# Patient Record
Sex: Female | Born: 1990 | Race: White | Hispanic: No | Marital: Married
Health system: Midwestern US, Academic
[De-identification: ages and names within clinical notes are randomized; demographics above are authoritative.]

---

## 2020-08-09 ENCOUNTER — Encounter: Admit: 2020-08-09 | Discharge: 2020-08-09 | Payer: BC Managed Care – PPO

## 2020-08-09 DIAGNOSIS — C569 Malignant neoplasm of unspecified ovary: Secondary | ICD-10-CM

## 2020-08-10 ENCOUNTER — Encounter: Admit: 2020-08-10 | Discharge: 2020-08-10 | Payer: BC Managed Care – PPO

## 2020-08-10 DIAGNOSIS — R1900 Intra-abdominal and pelvic swelling, mass and lump, unspecified site: Secondary | ICD-10-CM

## 2020-08-10 NOTE — Telephone Encounter
RN faxed lab orders to [facility] [phone]) with confirmation.

## 2020-08-11 ENCOUNTER — Encounter: Admit: 2020-08-11 | Discharge: 2020-08-11 | Payer: BC Managed Care – PPO

## 2020-08-11 NOTE — Progress Notes
Interventional Radiology Outpatient Procedure Review Assessment        Reason for consult: Evaluate for pelvic masses      History of present illness:  Kristina Velazquez is a 30 y.o. 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  - Previous IR Anesthetic/Sedation History (if applicable): none  - Case discussed with referring Physician (Y/N): N  - Contraindications to procedure: none      Plan:  - This case has been reviewed and approved for IR percutaneous biopsy  - Patient position and modality: supine and [place]  - Intra-procedural Sedation/Medication Plan: Moderate sedation  - Procedure time frame: 10-14 days  - Anticoagulation: Per IR peri procedural anticoagulation management protocol  - Labs: Per IR pre procedural lab protocol  - Additional requests after review: none    Lab/Radiology/Other Diagnostic Tests:  Labs:  No pertinent labs             Noah Delaine, MD

## 2020-08-14 ENCOUNTER — Encounter: Admit: 2020-08-14 | Discharge: 2020-08-14 | Payer: BC Managed Care – PPO

## 2020-08-14 DIAGNOSIS — C569 Malignant neoplasm of unspecified ovary: Secondary | ICD-10-CM

## 2020-08-14 DIAGNOSIS — R1900 Intra-abdominal and pelvic swelling, mass and lump, unspecified site: Secondary | ICD-10-CM

## 2020-08-14 DIAGNOSIS — Z8481 Family history of carrier of genetic disease: Secondary | ICD-10-CM

## 2020-08-14 NOTE — Progress Notes
Interventional Radiology Outpatient Scheduling Checklist      1.  Name of Procedure(s):   Dual US Guided procedures:   Abdominal mass biopsy and Paracentesis    08/11/2020 9:01 AM CDT by Melba Coon     Approved? Yes   Attending Provider Reviewing the Case: Collins   Case Type: Body   Modality: Ultrasound   Position: Supine   Sedation Type: Moderate   Preferred Location: [facility]   IR Assessment & plan note completed Yes           2.  Date of Procedure:   08/16/2020      3.  Arrival Time:   1000      4.  Procedure Time:  1100      5.  Correct Procedural Room Assignment:  IR [facility] Room #6      6.  Blood Thinners Triaged and instructed per protocol: Y/N/NA:  NA  Confirmed accurate instructions sent to patient: Y/N:  NA       7.  Procedure Order Verified: Y/N:  Yes      9.  Patient instructed to have a driver: Y/N/NA:  Yes    10.  Patient instructed on NPO status: Y/N/NA:  Yes  0300 and 0900  Confirmed accurate instructions sent to patient: Y/N:  Yes    11.  Specimen needed: Y/N/NA:  Yes   Verified Order placed: Y/N:  Yes    12.  Allergies Verified:  Y/N:  Yes    13.  Is there an Iodine Allergy: Y/N:  No  Does the Procedure Require contrast: Y/N:  NO  If so, was the IR- Contrast Allergy Pre-Procedure Medication protocol ordered: Y/NA:  NA    14.  Does the patient have labs according to IR Pre-procedure Laboratory Parameter policy: Y/N/NA:  Yes   Located in Outside records  If No, was the patient instructed to obtain labs prior to procedure: Y/N/NA:  NA     15.  Will the patient need to be admitted or have a possible admission: Y/N:  No  If yes, confirmed accurate instructions sent to patient: Y/N/NA:  NA     16.  Patient States Understanding: Y/N:  Yes    17.  History of OSA:  Y/N:  No  If yes, confirm request to bring CPAP sent to patient: Y/N/NA:  NA    18. Does the patient have an insulin pump or continuous glucose monitor? Y/N  N/A   If yes, was the patient instructed that this will need to be removed for this procedure and to bring supplies to reapply once the procedure is complete? Y/N    19. Patient was sent electronic procedure instructions: Y/N:  Yes

## 2020-08-14 NOTE — Patient Education
Dear Ms Kristina Velazquez,    Thank you for choosing [facility] Interventional Radiology for your procedure. Your appointment information is listed below:    Appointment Date: 08/16/2020  Appointment Time:    11AM   Arrival Time:      10AM  Location:     ? Main Campus: [address] [place], [place]  [address]  Parking: [address] Park[address]age    INTERVENTIONAL RADIOLOGY  PRE-PROCEDURE INSTRUCTIONS SEDATION    You are scheduled for a procedure in Interventional Radiology with procedural sedation.  Please follow these instructions and any direction from your Primary Care/Managing Physician.  If you have questions about your procedure or need to reschedule please call [phone].    Medication Instructions:   You may take the following medications with a small sip of water:  Continue taking your normal morning medications as directed.    Diet Instructions:  a. (8) hours 3AM before your procedure, stop your regular diet and start a clear liquid diet.  b. (6) hours before your procedure, discontinue tube feedings and chewing tobacco.  c. (2) hours 9AM before your procedure discontinue clear liquids.  You should have nothing by mouth. This includes GUM or CANDY.     Clear Liquid Diet    Water  Apple or White Grape Juice  Coffee or tea without cream   Tea  White Cranberry Juice  Chicken Bouillon or Broth (no noodles)  Soda Pop  Popsicles   Beef Bouillon or Broth (no noodles)    Day of Exam Instructions:  1. Bathe or shower with an antibacterial soap prior to your appointment.  2. If you have a history of Obstructive Sleep Apnea (OSA) bring your CPAP/BIPAP.   3. Bring a list of your current medications and the dosages.  4. Wear comfortable clothing and leave valuables at home.  5. Arrive (1) hour prior to your appointment.  This time will be spent registering, interviewing, assessing, educating and preparing you for the test.  ? You will be with [place] anywhere from 30 minutes to 6 hours after your exam depending on your procedure.  6. You will be sedated for the procedure. A responsible adult must drive you home (no Bened[employer] Goad, taxis or buses are allowed) and stay with you overnight. If you do not have a driver we will be unable to perform your procedure.   7. You will not be able to return to work or drive the same day if receiving sedation.

## 2020-08-16 ENCOUNTER — Ambulatory Visit: Admit: 2020-08-16 | Discharge: 2020-08-16 | Payer: BC Managed Care – PPO

## 2020-08-16 ENCOUNTER — Encounter: Admit: 2020-08-16 | Discharge: 2020-08-16 | Payer: BC Managed Care – PPO

## 2020-08-16 DIAGNOSIS — R1900 Intra-abdominal and pelvic swelling, mass and lump, unspecified site: Secondary | ICD-10-CM

## 2020-08-16 DIAGNOSIS — C569 Malignant neoplasm of unspecified ovary: Principal | ICD-10-CM

## 2020-08-16 MED ORDER — FENTANYL CITRATE (PF) 50 MCG/ML IJ SOLN
0 refills | Status: CP
Start: 2020-08-16 — End: ?
  Administered 2020-08-16 (×3): 50 ug via INTRAVENOUS

## 2020-08-16 MED ORDER — MIDAZOLAM 1 MG/ML IJ SOLN
1 mg | Freq: Once | INTRAVENOUS | 0 refills | Status: CP
Start: 2020-08-16 — End: ?
  Administered 2020-08-16: 16:00:00 1 mg via INTRAVENOUS

## 2020-08-16 MED ORDER — MIDAZOLAM 1 MG/ML IJ SOLN
0 refills | Status: CP
Start: 2020-08-16 — End: ?
  Administered 2020-08-16 (×3): 1 mg via INTRAVENOUS

## 2020-08-16 NOTE — Other
Immediate Post Procedure Note    Date:  08/16/2020                                         Attending Physician:   Baldwin Jamaica, MD  Performing Provider:  Erick Colace, MD    Consent:  Consent obtained from patient.  Time out performed: Consent obtained, correct patient verified, correct procedure verified, correct site verified, patient marked as necessary.  Pre/Post Procedure Diagnosis:  Abdominal masses, ascites  Indications:  Abdominal masses, ascites      Procedure(s):  US guided paracentesis, US guided mass biopsy  Findings:  Very small volume ascites near liver, large pelvic mass     Estimated Blood Loss:  None/Negligible  Specimen(s) Removed/Disposition:  Yes, sent to pathology  Complications: None  Patient Tolerated Procedure: Well  Post-Procedure Condition:  stable    Erick Colace, MD

## 2020-08-16 NOTE — Progress Notes
Sedation physician present in room. Recent vitals and patient condition reviewed between sedating physician and nurse. Reassessment completed. Determination made to proceed with planned sedation.

## 2020-08-16 NOTE — Progress Notes
Discharge instructions reviewed and pt verbalizes understaning. Pt ready for d/c at this time. Denies pain, sob or cp. Pt is able to ambulate to transfer self to w/c. Pt taken to exit in w/c with all belongings and printed AVS. NAD VSS

## 2020-08-16 NOTE — H&P (View-Only)
Pre Procedure History and Physical/Sedation Plan-OP    Procedure Date: 08/16/2020     Planned Procedure(s):  Pelvic mass biopsy and paracentesis     Indication for exam:  Pelvic mass, ascites   __________________________________________________________________    Chief Complaint:  See above     History of Present Illness: Kristina Velazquez is a 30 y.o. female that presents to IR today for pelvic mass biopsy and paracentesis. Patient reports feeling well except for anxiety r/t procedure and some abdominal distention.     There are no problems to display for this patient.    No past medical history on file.   No past surgical history on file.   Medications Prior to Admission   Medication Sig Dispense Refill Last Dose   ? ALPRAZolam (XANAX) 0.5 mg tablet Take 0.5 mg by mouth twice daily as needed for Anxiety.   Past Week   ? dextroamphetamine/amphetamine (ADDERALL XR PO) Take 30 mg by mouth once.   Past Week   ? ondansetron HCL (ZOFRAN) 4 mg tablet Take 4 mg by mouth every 8 hours as needed for Nausea or Vomiting.   Past Week     Allergies   Allergen Reactions   ? Peanut RASH   ? Hydrocodone STOMACH UPSET       Social History:   Social History     Tobacco Use   ? Smoking status: Not on file   ? Smokeless tobacco: Not on file   Substance Use Topics   ? Alcohol use: Not on file      No family history on file.     Review of Systems  Constitutional: negative for fevers and chills  Respiratory: negative for cough or dyspnea  Gastrointestinal: negative for nausea, vomiting and abdominal pain    Previous Anesthetic/Sedation History:  Denies adverse events r/t sedation/anesthesia.      Code Status: Full Code    Physical Exam:  Vital Signs: Last Filed In 24 Hours Vital Signs: 24 Hour Range   BP: 124/89 (07/20 1004)  Temp: 36.9 ?C (98.4 ?F) (07/20 1004)  Pulse: 107 (07/20 1004)  Respirations: 24 PER MINUTE (07/20 1004)  SpO2: 99 % (07/20 1004)  O2 Device: None (Room air) (07/20 1004)  SpO2 Pulse: 104 (07/20 1004) BP: (124)/(89)   Temp:  [36.9 ?C (98.4 ?F)]   Pulse:  [107]   Respirations:  [24 PER MINUTE]   SpO2:  [99 %]   O2 Device: None (Room air)            General appearance: alert and no distress  Neurologic: Grossly normal, at baseline  Lungs: Nonlabored with normal effort  Abdomen: soft, distended        Airway:  airway assessment performed  Mallampati II (soft palate, uvula, fauces visible)   Anesthesia Classification:  ASA II (A normal patient with mild systemic disease)  Pre procedure anxiolysis plan: Midazolam  Sedation/Medication Plan: Fentanyl, Lorazepam and Midazolam  Personal history of sedation complications: Denies adverse event.   Family history of sedation complications: Denies adverse event.   Medications for Reversal: Naloxone and Flumazenil  Discussion/Reviews:  Physician has discussed risks and alternatives of this type of sedation and above planned procedures with patient  NPO Status: Acceptable  Pregnancy Status: N/A        Lab/Radiology/Other Diagnostic Tests:  Labs:  Pertinent labs reviewed - OSH labs from 08/10/2020 hgb 13.7, plt 456           Kelyn Koskela M Nugent, APRN-NP  Pager  [phone]

## 2020-08-16 NOTE — Patient Instructions
INTERVENTIONAL RADIOLOGY DISCHARGE INSTRUCTIONS  PERCUTANEOUS BIOPSY  A percutaneous biopsy is a procedure in which a tiny sample of tissue is taken by using a needle inserted through your skin (percutaneously) into the affected area.?This procedure may also be referred to as a ?fine needle aspiration?Marland Kitchen The radiologist will determine whether the procedure is to be done using ultrasound or CT guidance.?  POST-PROCEDURE PAIN:   ? Pain control following your procedure is a priority for both you and your Physicians.  ? Some soreness or tenderness at the site is to be expected for several days. We recommend taking over the counter analgesics to help relieve this pain.  ? Alternative methods for pain relief include but not limited to heat or cold compress, relaxation techniques, rest, and changing of positions.?  ? If pain continues after 5-7 days or you have severe pain not relieved by medication, please contact us as directed below.?  POST-PROCEDURE ACTIVITY:   ? A responsible adult must drive you home. ???  ? If you receive sedation, narcotic pain medication or anesthesia for the procedure, you should not drive or operate heavy machinery or do anything that requires concentration for at least 24 hours after procedure completion.  ? It is recommended that a responsible adult be with you until morning.  ? Other activity restrictions such as lifting restrictions will depend upon the biopsy area and will be determined by the physician after your procedure.?  POST-PROCEDURE SITE CARE:  ? You will have a small bandage over the procedure site.? Keep this dry.?  ? You may remove it in 24 hours.  ? You may shower in 24 hours, after removing the bandage.  ? Do not submerge the procedure site for 1 week (no bathtub, swimming, hot tub, etc.)  ? Do not use ointments, creams or powders on the puncture site.  ? Be sure your hands are clean when touching near the site.?  DIET/MEDICATIONS:   ? You may resume your previous diet after the procedure.  ? If you receive sedation or narcotic pain medications, avoid any foods or beverages containing alcohol for at least 24 hours after the procedure.  ? Please see the Medication Reconciliation sheet for instructions regarding resuming your home medications.?  CALL THE DOCTOR IF:   ? Bright red blood soaks the bandage.  ? You have pain not relieved by medication.? Some soreness at the site is to be expected.  ? You have signs of infection such as: fever greater than 101F, chills, redness, warmth, swelling, drainage or pus from the puncture site.  ?  For any of the above symptoms or for problems or concerns related to the procedure performed at the [facility] location, call?[phone] Monday-Friday from 7-5p.? After-hours and weekends, please call?[phone] and ask for the Interventional Radiology Resident on-call.  YOU OR YOUR CAREGIVER SHOULD CALL 911 FOR ANY SEVERE SYMPTOMS SUCH AS EXCESSIVE BLEEDING, SEVERE DIZZINESS, TROUBLE BREATHING OR LOSS OF CONSCIOUSNESS.  ?  ?              IR-PARACENTESIS   A paracentesis is the removal of an abnormal buildup of fluid in your abdominal cavity. This fluid buildup is called ascites and may be caused by conditions such as liver disease, heart failure, or cancer. During this procedure, a needle is inserted into your abdomen to drain the fluid. The fluid may then be sent to the lab for testing if medically indicated. Removal of the fluid may also relieve belly pressure and shortness of breath  caused by the ascites.?  POST-PROCEDURE PAIN:   ? Pain control following your procedure is a priority for both you and your Physicians.  ? Some soreness or tenderness at the site is to be expected for several days. We recommend taking over the counter analgesics to help relieve this pain.  ? Alternative methods for pain relief include but not limited to heat or cold compress, relaxation techniques, rest, and changing of positions.  ? If pain continues after 5-7 days or you have severe pain not relieved by medication, please contact us as directed below.?  POST-PROCEDURE ACTIVITY:   ? A responsible adult must drive you home.  ? If you receive sedation, narcotic pain medication or anesthesia for the procedure, you should not drive or operate heavy machinery or do anything that requires concentration for at least 24 hours after procedure completion.  ? It is recommended that a responsible adult be with you until morning.?  POST-PROCEDURE SITE CARE:   ? You will have a small bandage over the procedure site. Keep this dry.  ? You may remove it in 24 hours.  ? You may shower in 24 hours, after removing the bandage.  ? A dry gauze bandage may be reapplied as necessary to protect your clothing as the site may sometimes leak for several days after the procedure.  ? Do not submerge the procedure site for 1 week (no bathtub, swimming, hot tub, etc.)  ? Do not use ointments, creams or powders on the puncture site.  ? Be sure your hands are clean when touching near the site.?  DIET/MEDICATIONS:   ? You may resume your previous diet after the procedure.  ? If you receive sedation or narcotic pain medications, avoid any foods or beverages containing alcohol for at least 24 hours after the procedure.  ? Please see the Medication Reconciliation sheet for instructions regarding resuming your home medications.?  CALL THE DOCTOR IF:   ? Bright red blood soaks the bandage.  ? You have pain not relieved by medication. Some soreness at the site is to be expected.  ? You have signs of infection such as: fever greater than 101F, chills, redness, warmth, swelling, drainage or pus from the puncture site.  For any of the above symptoms or for problems or concerns related to the procedure performed at the [facility], call [phone] Monday-Friday from 7-5p. After-hours and weekends, please call [phone] and ask for the Interventional Radiology Resident on-call.   You or your caregiver should call 911 for any severe symptoms such as excessive bleeding, severe dizziness, trouble breathing or loss of consciousness.   ?  ?

## 2020-08-17 ENCOUNTER — Encounter: Admit: 2020-08-17 | Discharge: 2020-08-17 | Payer: BC Managed Care – PPO

## 2020-08-17 NOTE — Telephone Encounter
Received incoming VM from Dr. 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 Grading still pending additional immunostains". This RN will notify Dr. Martin Majestic of this information. Patient is currently scheduled for new patient consult on 09/04/20.

## 2020-08-21 ENCOUNTER — Encounter: Admit: 2020-08-21 | Discharge: 2020-08-21 | Payer: BC Managed Care – PPO

## 2020-08-21 DIAGNOSIS — C569 Malignant neoplasm of unspecified ovary: Secondary | ICD-10-CM

## 2020-08-21 LAB — PROTIME INR (PT)
INR: 1.1 (ref 0.8–1.2)
PROTIME: 12 s (ref 9.5–14.2)

## 2020-08-21 LAB — CA125: CA-125: 143 U/mL — ABNORMAL HIGH (ref <35)

## 2020-08-21 LAB — PTT (APTT): PTT: 31 s (ref 24.0–36.5)

## 2020-08-22 ENCOUNTER — Encounter: Admit: 2020-08-22 | Discharge: 2020-08-22 | Payer: BC Managed Care – PPO

## 2020-08-22 ENCOUNTER — Ambulatory Visit: Admit: 2020-08-22 | Discharge: 2020-08-22 | Payer: BC Managed Care – PPO

## 2020-08-22 DIAGNOSIS — C569 Malignant neoplasm of unspecified ovary: Secondary | ICD-10-CM

## 2020-08-22 MED ORDER — FAMOTIDINE (PF) 20 MG/2 ML IV SOLN
20 mg | Freq: Once | INTRAVENOUS | 0 refills
Start: 2020-08-22 — End: ?

## 2020-08-22 MED ORDER — PALONOSETRON 0.25 MG/5 ML IV SOLN
.25 mg | Freq: Once | INTRAVENOUS | 0 refills
Start: 2020-08-22 — End: ?

## 2020-08-22 MED ORDER — CARBOPLATIN IVPB (BY AUC-GOG)
899.4 mg | Freq: Once | INTRAVENOUS | 0 refills
Start: 2020-08-22 — End: ?

## 2020-08-22 MED ORDER — SODIUM CHLORIDE 0.9 % IV SOLP
500 mL | Freq: Once | INTRAVENOUS | 0 refills
Start: 2020-08-22 — End: ?

## 2020-08-22 MED ORDER — NS WITH POTASSIUM CHLOR 20 MEQ, MAG SULF 2 G/L IV INFUSION
Freq: Once | INTRAVENOUS | 0 refills
Start: 2020-08-22 — End: ?

## 2020-08-22 MED ORDER — DEXAMETHASONE IVPB
20 mg | Freq: Once | INTRAVENOUS | 0 refills
Start: 2020-08-22 — End: ?

## 2020-08-22 MED ORDER — PACLITAXEL 500ML IVPB
175 mg/m2 | Freq: Once | INTRAVENOUS | 0 refills
Start: 2020-08-22 — End: ?

## 2020-08-22 MED ORDER — DIPHENHYDRAMINE HCL 50 MG/ML IJ SOLN
50 mg | Freq: Once | INTRAVENOUS | 0 refills
Start: 2020-08-22 — End: ?

## 2020-08-22 MED ORDER — APREPITANT 7.2 MG/ML IV EMUL
130 mg | Freq: Once | INTRAVENOUS | 0 refills
Start: 2020-08-22 — End: ?

## 2020-08-22 MED ORDER — CEFAZOLIN 1 GRAM IJ SOLR
2 g | Freq: Once | INTRAVENOUS | 0 refills
Start: 2020-08-22 — End: ?

## 2020-08-22 NOTE — Telephone Encounter
Pt scheduled for Port Insert with Dr. Mordecai Rasmussen on 08/25/20 at [facility].    Instructed use of anesthesia vs. local only would be determined the morning of surgery.    Instructions given, pt states understanding.

## 2020-08-23 ENCOUNTER — Encounter: Admit: 2020-08-23 | Discharge: 2020-08-23 | Payer: BC Managed Care – PPO

## 2020-08-23 DIAGNOSIS — C569 Malignant neoplasm of unspecified ovary: Secondary | ICD-10-CM

## 2020-08-23 DIAGNOSIS — R000 Tachycardia, unspecified: Secondary | ICD-10-CM

## 2020-08-23 NOTE — Telephone Encounter
This RN faxed completed CARIS form to [employer]. Included demographic information, insurance cards, pathology report and most recent office note. Fax confirmation received.

## 2020-08-24 ENCOUNTER — Encounter: Admit: 2020-08-24 | Discharge: 2020-08-24 | Payer: BC Managed Care – PPO

## 2020-08-24 NOTE — Anesthesia Pre-Procedure Evaluation
Anesthesia Pre-Procedure Evaluation    Name: Kristina Velazquez      MRN: [id number]     DOB: 12-17-1990     Age: 30 y.o.     Sex: female   _________________________________________________________________________     Procedure Info:   Procedure Information     Date/Time: 08/25/20 1205    Procedures:       placement of port-a-cath with fluoroscopy (N/A )      FLUOROSCOPIC GUIDANCE CENTRAL VENOUS ACCESS DEVICE PLACEMENT/ REPLACEMENT/ REMOVAL (N/A )    Location: [facility] / [facility] OR    Surgeons: Carloyn Manner., MD          Physical Assessment  Vital Signs (last filed in past 24 hours):         Patient History   Allergies   Allergen Reactions   ? Peanut RASH   ? Hydrocodone STOMACH UPSET        Current Medications    Medication Directions   ALPRAZolam (XANAX) 1 mg tablet Take 1 mg by mouth daily as needed for Anxiety.   amphetamine-dextroamphetamine XR (ADDERALL XR) 30 mg capsule Take 30 mg by mouth twice daily.   dexAMETHasone (DECADRON) 4 mg tablet Take two tablets by mouth daily. On Days 2-4 of each cycle.   ibuprofen (ADVIL) 200 mg tablet Take 200-400 mg by mouth every 8 hours as needed for Pain. Take with food.   lidocaine/prilocaine (EMLA) 2.5/2.5 % topical cream Apply  topically to affected area as Needed. Apply 30-60 minutes prior to port access.   MULTIVITAMIN PO Take 1 tablet by mouth daily.   ondansetron HCL (ZOFRAN) 8 mg tablet Take one tablet by mouth every 8 hours as needed (nausea and vomiting).   pantoprazole DR (PROTONIX) 20 mg tablet Take 20 mg by mouth daily.   prochlorperazine maleate (COMPAZINE) 10 mg tablet Take one tablet by mouth every 6 hours as needed for Nausea or Vomiting.   traMADoL (ULTRAM) 50 mg tablet Take 50 mg by mouth every 8 hours as needed for Pain.         Review of Systems/Medical History      Patient summary reviewed  Nursing notes reviewed  Pertinent labs reviewed    PONV Screening: Female gender, Non-smoker and Postoperative opioids  No history of anesthetic complications  No family history of anesthetic complications      Airway - negative        Pulmonary - negative          Cardiovascular         Exercise tolerance: >4 METS      Beta Blocker therapy: No      Beta blockers within 24 hours: n/a      Dysrhythmias (h/o ST)      Pt denies any h/o MI, CHF or significant arrhythmias.  Pt denies any CP or SOB with > 4 metabolic equivalents.          GI/Hepatic/Renal - negative        Neuro/Psych - negative        Musculoskeletal - negative        Endocrine/Other         Malignancy (ovarian cancer)      Obesity    Constitution - negative   Physical Exam    Airway Findings      Mallampati: II      TM distance: >3 FB      Neck ROM: full  Mouth opening: good      Airway patency: adequate    Dental Findings: Negative      Cardiovascular Findings: Negative      Rhythm: regular      Rate: normal    Pulmonary Findings: Negative      Breath sounds clear to auscultation.    Neurological Findings: Negative      Constitutional findings: Negative       Diagnostic Tests  Hematology:   Lab Results   Component Value Date    HGB 13.8 08/21/2020    HCT 40.6 08/21/2020    PLTCT 502 08/21/2020    WBC 9.3 08/21/2020    NEUT 74 08/21/2020    ANC 6.90 08/21/2020    ALC 1.60 08/21/2020    MONA 7 08/21/2020    AMC 0.70 08/21/2020    EOSA 1 08/21/2020    ABC 0.10 08/21/2020    MCV 83.5 08/21/2020    MCH 28.4 08/21/2020    MCHC 34.0 08/21/2020    MPV 7.2 08/21/2020    RDW 13.0 08/21/2020         General Chemistry:   Lab Results   Component Value Date    NA 138 08/21/2020    K 4.2 08/21/2020    CL 105 08/21/2020    CO2 26 08/21/2020    GAP 7 08/21/2020    BUN 9 08/21/2020    CR 0.81 08/21/2020    GLU 75 08/21/2020    CA 9.4 08/21/2020    ALBUMIN 4.2 08/21/2020    TOTBILI 0.5 08/21/2020      Coagulation:   Lab Results   Component Value Date    PT 12.5 08/21/2020    PTT 31.3 08/21/2020    INR 1.1 08/21/2020         Anesthesia Plan    ASA score: 3   Plan: MAC  NPO status: acceptable      Informed Consent  Anesthetic plan and risks discussed with patient.  Use of blood products discussed with patient      Plan discussed with: CRNA.

## 2020-08-25 ENCOUNTER — Encounter: Admit: 2020-08-25 | Discharge: 2020-08-25 | Payer: BC Managed Care – PPO

## 2020-08-25 ENCOUNTER — Ambulatory Visit: Admit: 2020-08-25 | Discharge: 2020-08-25 | Payer: BC Managed Care – PPO

## 2020-08-25 DIAGNOSIS — C569 Malignant neoplasm of unspecified ovary: Secondary | ICD-10-CM

## 2020-08-25 DIAGNOSIS — R000 Tachycardia, unspecified: Secondary | ICD-10-CM

## 2020-08-25 MED ORDER — PROPOFOL 10 MG/ML IV EMUL 20 ML (INFUSION)(AM)(OR)
INTRAVENOUS | 0 refills | Status: DC
Start: 2020-08-25 — End: 2020-08-25

## 2020-08-25 MED ORDER — FENTANYL CITRATE (PF) 50 MCG/ML IJ SOLN
INTRAVENOUS | 0 refills | Status: DC
Start: 2020-08-25 — End: 2020-08-25

## 2020-08-25 MED ORDER — PROPOFOL INJ 10 MG/ML IV VIAL
INTRAVENOUS | 0 refills | Status: DC
Start: 2020-08-25 — End: 2020-08-25

## 2020-08-25 MED ORDER — LIDOCAINE (PF) 100 MG/5 ML (2 %) IV SYRG
INTRAVENOUS | 0 refills | Status: DC
Start: 2020-08-25 — End: 2020-08-25

## 2020-08-25 MED ORDER — MIDAZOLAM 1 MG/ML IJ SOLN
INTRAVENOUS | 0 refills | Status: DC
Start: 2020-08-25 — End: 2020-08-25

## 2020-08-28 ENCOUNTER — Encounter: Admit: 2020-08-28 | Discharge: 2020-08-28 | Payer: BC Managed Care – PPO

## 2020-08-30 ENCOUNTER — Encounter: Admit: 2020-08-30 | Discharge: 2020-08-30 | Payer: BC Managed Care – PPO

## 2020-08-30 NOTE — Telephone Encounter
Lab orders signed and faxed back to number provided.  Confirmation of fax sending received.

## 2020-09-04 ENCOUNTER — Encounter: Admit: 2020-09-04 | Discharge: 2020-09-04 | Payer: BC Managed Care – PPO

## 2020-09-04 DIAGNOSIS — C569 Malignant neoplasm of unspecified ovary: Secondary | ICD-10-CM

## 2020-09-04 MED ORDER — METRONIDAZOLE 500 MG PO TAB
ORAL_TABLET | 0 refills | Status: AC
Start: 2020-09-04 — End: ?

## 2020-09-04 MED ORDER — EPINEPHRINE 0.3 MG/0.3 ML IJ ATIN
.3 mg | INTRAMUSCULAR | 0 refills | Status: AC | PRN
Start: 2020-09-04 — End: ?

## 2020-09-04 MED ORDER — POTASSIUM CHLORIDE IN 0.9%NACL 20 MEQ/L IV SOLP
1000 mL | Freq: Once | INTRAVENOUS | 0 refills
Start: 2020-09-04 — End: ?

## 2020-09-04 MED ORDER — CELECOXIB 100 MG PO CAP
200 mg | Freq: Once | ORAL | 0 refills
Start: 2020-09-04 — End: ?

## 2020-09-04 MED ORDER — PRESURGERY KIT A
PACK | 0 refills | Status: AC
Start: 2020-09-04 — End: ?

## 2020-09-04 MED ORDER — DIPHENHYDRAMINE HCL 50 MG/ML IJ SOLN
50 mg | Freq: Once | INTRAVENOUS | 0 refills | Status: CP
Start: 2020-09-04 — End: ?
  Administered 2020-09-04: 16:00:00 50 mg via INTRAVENOUS

## 2020-09-04 MED ORDER — FAMOTIDINE (PF) 20 MG/2 ML IV SOLN
20 mg | Freq: Once | INTRAVENOUS | 0 refills | Status: CP
Start: 2020-09-04 — End: ?
  Administered 2020-09-04: 18:00:00 20 mg via INTRAVENOUS

## 2020-09-04 MED ORDER — PALONOSETRON 0.25 MG/5 ML IV SOLN
.25 mg | Freq: Once | INTRAVENOUS | 0 refills | Status: CP
Start: 2020-09-04 — End: ?
  Administered 2020-09-04: 16:00:00 0.25 mg via INTRAVENOUS

## 2020-09-04 MED ORDER — FAMOTIDINE (PF) 20 MG/2 ML IV SOLN
20 mg | Freq: Once | INTRAVENOUS | 0 refills | Status: CP
Start: 2020-09-04 — End: ?
  Administered 2020-09-04: 17:00:00 20 mg via INTRAVENOUS

## 2020-09-04 MED ORDER — GABAPENTIN 300 MG PO CAP
600 mg | Freq: Once | ORAL | 0 refills
Start: 2020-09-04 — End: ?

## 2020-09-04 MED ORDER — METHYLPREDNISOLONE SOD SUC(PF) 125 MG/2 ML IJ SOLR
125 mg | Freq: Once | INTRAVENOUS | 0 refills | Status: CP
Start: 2020-09-04 — End: ?
  Administered 2020-09-04: 17:00:00 125 mg via INTRAVENOUS

## 2020-09-04 MED ORDER — APREPITANT 7.2 MG/ML IV EMUL
130 mg | Freq: Once | INTRAVENOUS | 0 refills | Status: CP
Start: 2020-09-04 — End: ?
  Administered 2020-09-04: 16:00:00 130 mg via INTRAVENOUS

## 2020-09-04 MED ORDER — ACETAMINOPHEN 500 MG PO TAB
1000 mg | Freq: Once | ORAL | 0 refills
Start: 2020-09-04 — End: ?

## 2020-09-04 MED ORDER — SODIUM CHLORIDE 0.9 % IV SOLP
500 mL | INTRAVENOUS | 0 refills | Status: CP
Start: 2020-09-04 — End: ?
  Administered 2020-09-04: 18:00:00 500 mL via INTRAVENOUS

## 2020-09-04 MED ORDER — POTASSIUM CHLORIDE IN 0.9%NACL 20 MEQ/L IV SOLP
1000 mL | Freq: Once | INTRAVENOUS | 0 refills | Status: AC
Start: 2020-09-04 — End: ?

## 2020-09-04 MED ORDER — DIPHENHYDRAMINE HCL 50 MG/ML IJ SOLN
50 mg | Freq: Once | INTRAVENOUS | 0 refills | Status: CP
Start: 2020-09-04 — End: ?
  Administered 2020-09-04: 18:00:00 50 mg via INTRAVENOUS

## 2020-09-04 MED ORDER — CARBOPLATIN IVPB (BY AUC-GOG)
900 mg | Freq: Once | INTRAVENOUS | 0 refills | Status: AC
Start: 2020-09-04 — End: ?

## 2020-09-04 MED ORDER — DEXAMETHASONE IVPB
20 mg | Freq: Once | INTRAVENOUS | 0 refills | Status: CP
Start: 2020-09-04 — End: ?
  Administered 2020-09-04 (×2): 20 mg via INTRAVENOUS

## 2020-09-04 MED ORDER — FAMOTIDINE (PF) 20 MG/2 ML IV SOLN
20 mg | Freq: Once | INTRAVENOUS | 0 refills | Status: CP
Start: 2020-09-04 — End: ?
  Administered 2020-09-04: 16:00:00 20 mg via INTRAVENOUS

## 2020-09-04 MED ORDER — SODIUM CHLORIDE 0.9 % IV SOLP
500 mL | Freq: Once | INTRAVENOUS | 0 refills | Status: CP
Start: 2020-09-04 — End: ?
  Administered 2020-09-04: 16:00:00 500 mL via INTRAVENOUS

## 2020-09-04 MED ORDER — CEFAZOLIN IN 0.9% SOD CHLORIDE 2 GRAM/110 ML IVPB
2 g | Freq: Once | INTRAVENOUS | 0 refills
Start: 2020-09-04 — End: ?

## 2020-09-04 MED ORDER — PACLITAXEL 500ML IVPB
175 mg/m2 | Freq: Once | INTRAVENOUS | 0 refills | Status: CP
Start: 2020-09-04 — End: ?
  Administered 2020-09-04 (×2): 376.248 mg via INTRAVENOUS

## 2020-09-04 MED ORDER — ALBUTEROL SULFATE 2.5 MG/0.5 ML IN NEBU
2.5 mg | Freq: Once | RESPIRATORY_TRACT | 0 refills | Status: AC
Start: 2020-09-04 — End: ?

## 2020-09-04 MED ORDER — CARBOPLATIN IVPB (BY AUC-GOG)
900 mg | Freq: Once | INTRAVENOUS | 0 refills
Start: 2020-09-04 — End: ?

## 2020-09-04 MED ORDER — SODIUM CHLORIDE 0.9 % IV SOLP
250 mL | INTRAVENOUS | 0 refills
Start: 2020-09-04 — End: ?

## 2020-09-04 MED ORDER — SODIUM CHLORIDE 0.9 % IV SOLP
500 mL | INTRAVENOUS | 0 refills | Status: CP
Start: 2020-09-04 — End: ?
  Administered 2020-09-04: 17:00:00 500 mL via INTRAVENOUS

## 2020-09-04 MED ORDER — MAGNESIUM SULFATE IN WATER 2 GRAM/50 ML (4 %) IV PGBK
2 g | Freq: Once | INTRAVENOUS | 0 refills | Status: AC
Start: 2020-09-04 — End: ?

## 2020-09-04 MED ORDER — DIPHENHYDRAMINE HCL 50 MG/ML IJ SOLN
50 mg | Freq: Once | INTRAVENOUS | 0 refills | Status: CP
Start: 2020-09-04 — End: ?
  Administered 2020-09-04: 17:00:00 50 mg via INTRAVENOUS

## 2020-09-04 MED ORDER — HEPARIN, PORCINE (PF) 100 UNIT/ML IV SYRG
500 [IU] | Freq: Once | 0 refills | Status: AC
Start: 2020-09-04 — End: ?

## 2020-09-04 MED ORDER — MAGNESIUM SULFATE IN WATER 2 GRAM/50 ML (4 %) IV PGBK
2 g | Freq: Once | INTRAVENOUS | 0 refills
Start: 2020-09-04 — End: ?

## 2020-09-04 MED ORDER — METHYLPREDNISOLONE SOD SUC(PF) 125 MG/2 ML IJ SOLR
125 mg | Freq: Once | INTRAVENOUS | 0 refills | Status: CP
Start: 2020-09-04 — End: ?
  Administered 2020-09-04: 19:00:00 125 mg via INTRAVENOUS

## 2020-09-04 MED ORDER — METRONIDAZOLE IN NACL (ISO-OS) 500 MG/100 ML IV PGBK
500 mg | Freq: Once | INTRAVENOUS | 0 refills
Start: 2020-09-04 — End: ?

## 2020-09-04 NOTE — Progress Notes
Plan: second touch     Intervention: SW met with pt in tx for a second touch apt, however, pt had a reaction to the chemo and tx RNs tending to pts needs. SW briefly introduced self. Informed pt this worker was dropping off info and business card; encouraged pt to contact this worker at her convenience to further discuss. SW left pt with [employer] and [employer] as well as a list of a few financial grants that pt may qualify to apply for should she have a loss of income. SW to remain available should needs arise.     [place], LMSW

## 2020-09-04 NOTE — Progress Notes
CHEMO NOTE  Verified chemo consent signed and in chart.    Blood return positive via: Port (Single)    BSA and dose double checked (agree with orders as written) with: yes     Labs/applicable tests checked: CBC and Comprehensive Metabolic Panel (CMP)    Chemo regimen: C1D1  PACLitaxeL (TAXOL) 376.248 mg in sodium chloride 0.9% (NS) 562.708 mL IVPB   CARBOplatin (PARAPLATIN) 900 mg in sodium chloride 0.9% (NS) 340 mL IVPB     Rate verified and armband double check with second RN: yes    Patient education offered and stated understanding. Denies questions at this time.    Patient was directly monitored for the first initiation of Taxol infusion. 7 minutes into the Taxol treatment, the patient reported having burning in her chest and throat, feeling like her lips were swelling, "seeing stars", and she was visibly red. Infusion was immediately stopped and hypersensitivity protocol was followed. Patient reports symptom resolution. See eMAR for details. Dr. Martin Majestic was contacted and ordered for the infusion to be restarted at 25% rate for 15 minutes. If tolerated increase rate to 50% rate for 15 minutes. If that is tolerated then increase to full rate to complete treatment.    34 minutes after infusing Taxol at full rate, the patient called out with complaints of feeling hot and sweaty with left sided flank pain described as intermittent, tight, and sharp. The patient was visibly pale. Infusion was immediately stopped and hypersensitivy protocol was followed. See eMAR for details. Patient's symptoms resolved. Dr. Martin Majestic was notified and ordered for Taxol to be restarted at 25% rate for 15 minutes. If that was tolerated then increase to 50% rate for the rest of the Taxol infusion.    Taxol titrated as order. Patient tolerating treatment well up to this point. Patient care handed off to Nelida Meuse., RN '@1720'$ .    Patient scheduled to come back back for Carboplatin and post-hydration tomorrow 09/05/20 at 1430.

## 2020-09-04 NOTE — Progress Notes
Chemotherapy Reaction    Patient: Kristina Velazquez is a 30 y.o. female    MRN#:  [id number]    Chemotherapy Cycle 1 Day 1    Agent: paclitaxel    Route: IV    Time Started: 1135    Time of Reaction: 1142    Rate at Time of Reaction:  187.6 mL/hr    Signs & Symptoms of Reaction: tachycardia, chest pain, vision disturbances and redness, tingling lips    Medications Administered: normal saline bolus, diphenhydramine, famotidine and methylprednisolone    Provider Notification (Name/Time): Dr. Ollen Bowl was notified at 1147    Time of Symptom Resolution: 1200    Intervention/Outcome: restarted with mini desensitization per verbal order from Dr. Martin Majestic    Allergy List Updated: Yes        Chemotherapy Reaction    Patient: Kristina Velazquez is a 30 y.o. female    MRN#:  [id number]    Chemotherapy Cycle 1 Day 1    Agent: paclitaxel    Route: IV    Time Re-Started: 1206    Time of Reaction: 1316    Rate at Time of Reaction: 187.6 mL/hr    Signs & Symptoms of Reaction: flushing, back pain and hot and sweaty    Medications Administered: normal saline bolus, diphenhydramine, famotidine and methylprednisolone    Provider Notification (Name/Time): Dr. Martin Majestic was notified at 1320    Time of Symptom Resolution: 1415    Intervention/Outcome: restarted Taxol infusion at 25% rate for 15 minutes. If tolerated, increased to 50% rate for the rest of Taxol treatment.     Allergy List Updated: Yes

## 2020-09-04 NOTE — Progress Notes
Clinical Nutrition Assessment Summary    Kristina Velazquez is a 30 y.o. female with ovarian cancer, FIGO stage IIIC   Past Medical History: reviewed    Current Treatment Plan: OP GYN CARBOPLATIN + PACLITAXEL (OVARIAN)     Nutrition Assessment of Patient:  BMI Categories Adult: Obesity Class I: 30-34.9  Desired Weight: 72 kg  Estimated Calorie Needs: 2160-2520 (30-35kcal/kg DW)  Estimated Protein Needs: 94-108 (1.3-1.5g/kg DW)  Needs to promote: weight maintainence    Wt Readings from Last 5 Encounters:   09/04/20 95.1 kg (209 lb 9.6 oz)   08/25/20 94.6 kg (208 lb 9.6 oz)   08/21/20 95.3 kg (210 lb 3.2 oz)      RD met with patient in treatment for 2nd touch appt.  She reports decreased appetite/intakes over the last few weeks.  Experiencing early satiety. Still trying to eat and making smoothies daily. Adding unflavored protein powder to her smoothies.  I discussed importance of adequate nutritional status with focus on weight maintenance and increased kcal, protein, and hydration needs.  I answered all questions and sent ACS booklet to her chart.  RD to f/u at next cycle.    Nutrition Diagnosis:  Food & nutrition-related knowledge deficit      Etiology: recommendations for new ovarian cancer and treatment      Signs & Symptoms: 2nd touch appt    Intervention/Plan:  ? Discussed kcal and protein needs during cancer treatment. Provided patient with list of foods high in protein. Reviewed dietary sources of protein, including meats, poultry, fish, eggs, milk, Greek yogurt, cottage cheese, soybeans and tofu, nuts/seeds/legumes, nut butters, oral supplements (powders and drinks). Encouraged inclusion of protein at all meals/snacks, discussed ideas including protein sources pt likes.  ? Provided healthy shake recipes/recommendations for ready to drink shakes. Encouraged use of homemade smoothies, potentially using oral supplement as base with additions of ice cream, nut butter, fruit, spoonful oil for more calories.  ? Encouraged small, frequent meals and snacks, every 2-3 hours. Discussed strategies to optimize intake in setting of low appetite, encouraged small frequent meals including preferred foods for appetite stimulation. Suggested using timer or cues to remind pt to attempt eating q2-3hr and maximizing intake when appetite is present.   ? Discussed energy-dense foods and methods of caloric enhancement, to include healthy sources of fat such as olive oil, avocado, hummus or garbanzo beans, nut butters.  ? Discussed nutritional/lifestyle guidelines for cancer such as healthy diet (focus on fruits/vegetables, whole grains, lean protein sources), and importance of physical activity.   ? RD will be available to patient while undergoing treatment to assess for appetite and weight changes, along with chemo side effect management.   ? I reinforced need for adequate hydration during chemotherapy (2-3L fluids/day).  ? Patient was provided with RD's contact information for further assistance if needed. Will be available PRN.    Nutrition Monitoring and Evaluation:  Goal: weight maintenance; high protein diet  Time Frame: ongoing    The patient was allowed to ask questions and actively participated in creating plan of care. I provided the patient with my contact information and instructed pt on how to get in touch with me if questions or concerns arise. Thank you for allowing nutrition services to participate in this patients care.     Follow up Date: in tx    Fredderick Phenix, MS, RD, CSO, LD   Clinical Dietitian Specialist in Oncology  Phone: [phone]

## 2020-09-04 NOTE — Progress Notes
CHEMO NOTE  Verified chemo consent signed and in chart.    Blood return positive via: Port (Single and Accessed)    BSA and dose double checked (agree with orders as written) with: yes     Labs/applicable tests checked: CBC, Comprehensive Metabolic Panel (CMP) and Tumor Marker    Chemo regimen: Drug/cycle/day    CARBOplatin (PARAPLATIN) 900 mg in sodium chloride 0.9% (NS) 340 mL IVPB    Rate verified and armband double check with second RN: yes    Patient education offered and stated understanding. Denies questions at this time.    Pt arrived to treatment to complete remainder of of treatment from 8/8, C1D2 Carboplatin. C1D1 was delayed due to reaction prior day and unable to complete carboplatin dose before CC treatment closing time. Pt has no further concerns or questions at this time. Port with positive blood return pre and post infusion.    1648: Carboplatin completed  [id number]: Pt called charge nurse into room complaining of feeling hot from her chest up with a headache. Pt flushed, warm to touch, and sweating  1702: Harlene Salts, APRN notified  [phone]: Benadryl '25mg'$   1708: Pepcid '20mg'$   1710: Tylenol '325mg'$   1715: E Carroll APRN to bedside, verbal order to administer Solumedrol in 15-20minutes if no improvement  1730: Pt expresses experiencing no relief. .  [id number]: Solumedrol '62mg'$   1744: Pt experiencing no relief with new onset of tingling in lips, per Venora Maples APRN administer another of Benadryl '25mg'$  and Solumedrol '62mg'$   1745: Benadryl '25mg'$    1751: Solumedrol '62mg'$   1807: Pt claims that tingling in lips has resolved, but she is still feeling hot, flushed, with no improvement. Venora Maples APRN notified, instructed to observe until further notice.  1818Venora Maples APRN to bedside. Pt to go to ER in own car for observation. RN wheeled pt to car with husband present.  1827: Report to ER called by Venora Maples APRN

## 2020-09-05 ENCOUNTER — Encounter: Admit: 2020-09-05 | Discharge: 2020-09-05 | Payer: BC Managed Care – PPO

## 2020-09-05 ENCOUNTER — Emergency Department
Admit: 2020-09-05 | Discharge: 2020-09-06 | Disposition: A | Discharge status: Home / Self Care | Payer: BC Managed Care – PPO

## 2020-09-05 DIAGNOSIS — C569 Malignant neoplasm of unspecified ovary: Secondary | ICD-10-CM

## 2020-09-05 DIAGNOSIS — R000 Tachycardia, unspecified: Secondary | ICD-10-CM

## 2020-09-05 DIAGNOSIS — R519 Acute nonintractable headache, unspecified headache type: Secondary | ICD-10-CM

## 2020-09-05 DIAGNOSIS — T50905A Adverse effect of unspecified drugs, medicaments and biological substances, initial encounter: Secondary | ICD-10-CM

## 2020-09-05 MED ORDER — FAMOTIDINE (PF) 20 MG/2 ML IV SOLN
20 mg | Freq: Once | INTRAVENOUS | 0 refills | Status: AC
Start: 2020-09-05 — End: ?

## 2020-09-05 MED ORDER — LACTATED RINGERS IV SOLP
1000 mL | INTRAVENOUS | 0 refills | Status: CP
Start: 2020-09-05 — End: ?
  Administered 2020-09-06: 01:00:00 1000 mL via INTRAVENOUS

## 2020-09-05 MED ORDER — ACETAMINOPHEN 325 MG PO TAB
650 mg | Freq: Once | ORAL | 0 refills | Status: CP
Start: 2020-09-05 — End: ?
  Administered 2020-09-06: 01:00:00 650 mg via ORAL

## 2020-09-05 MED ORDER — METHYLPREDNISOLONE SOD SUC(PF) 125 MG/2 ML IJ SOLR
125 mg | Freq: Once | INTRAVENOUS | 0 refills | Status: CP
Start: 2020-09-05 — End: ?
  Administered 2020-09-05: 23:00:00 62 mg via INTRAVENOUS

## 2020-09-05 MED ORDER — DEXAMETHASONE 6 MG PO TAB
12 mg | Freq: Once | ORAL | 0 refills | Status: CP
Start: 2020-09-05 — End: ?
  Administered 2020-09-05: 20:00:00 12 mg via ORAL

## 2020-09-05 MED ORDER — CARBOPLATIN IVPB (BY AUC-GOG)
900 mg | Freq: Once | INTRAVENOUS | 0 refills | Status: CP
Start: 2020-09-05 — End: ?
  Administered 2020-09-05 (×2): 900 mg via INTRAVENOUS

## 2020-09-05 MED ORDER — HEPARIN, PORCINE (PF) 100 UNIT/ML IV SYRG
500 [IU] | Freq: Once | 0 refills | Status: CP
Start: 2020-09-05 — End: ?

## 2020-09-05 MED ORDER — HEPARIN, PORCINE (PF) 100 UNIT/ML IV SYRG
500 [IU] | Freq: Once | 0 refills | Status: AC
Start: 2020-09-05 — End: ?

## 2020-09-05 MED ORDER — MAGNESIUM SULFATE IN WATER 2 GRAM/50 ML (4 %) IV PGBK
2 g | Freq: Once | INTRAVENOUS | 0 refills | Status: CP
Start: 2020-09-05 — End: ?
  Administered 2020-09-05: 21:00:00 2 g via INTRAVENOUS

## 2020-09-05 MED ORDER — METHYLPREDNISOLONE SOD SUC(PF) 125 MG/2 ML IJ SOLR
62.5 mg | Freq: Once | INTRAVENOUS | 0 refills | Status: CP
Start: 2020-09-05 — End: ?
  Administered 2020-09-05: 23:00:00 62.5 mg via INTRAVENOUS

## 2020-09-05 MED ORDER — PROCHLORPERAZINE EDISYLATE 5 MG/ML IJ SOLN
10 mg | Freq: Once | INTRAVENOUS | 0 refills | Status: CP
Start: 2020-09-05 — End: ?
  Administered 2020-09-06: 01:00:00 10 mg via INTRAVENOUS

## 2020-09-05 MED ORDER — MAGNESIUM SULFATE IN D5W 1 GRAM/100 ML IV PGBK
1 g | Freq: Once | INTRAVENOUS | 0 refills | Status: CP
Start: 2020-09-05 — End: ?
  Administered 2020-09-06: 01:00:00 1 g via INTRAVENOUS

## 2020-09-05 MED ORDER — DIPHENHYDRAMINE HCL 50 MG/ML IJ SOLN
25 mg | Freq: Once | INTRAVENOUS | 0 refills | Status: CP
Start: 2020-09-05 — End: ?
  Administered 2020-09-05: 22:00:00 25 mg via INTRAVENOUS

## 2020-09-05 MED ORDER — ACETAMINOPHEN 325 MG PO TAB
650 mg | ORAL | 0 refills | Status: AC | PRN
Start: 2020-09-05 — End: ?
  Administered 2020-09-05: 22:00:00 650 mg via ORAL

## 2020-09-05 MED ORDER — POTASSIUM CHLORIDE IN 0.9%NACL 20 MEQ/L IV SOLP
1000 mL | Freq: Once | INTRAVENOUS | 0 refills | Status: CP
Start: 2020-09-05 — End: ?
  Administered 2020-09-05: 21:00:00 1000 mL via INTRAVENOUS

## 2020-09-05 MED ORDER — DIPHENHYDRAMINE HCL 50 MG/ML IJ SOLN
25 mg | Freq: Once | INTRAVENOUS | 0 refills | Status: CP
Start: 2020-09-05 — End: ?
  Administered 2020-09-05: 23:00:00 25 mg via INTRAVENOUS

## 2020-09-05 MED ADMIN — FAMOTIDINE (PF) 20 MG/2 ML IV SOLN [166077]: 20 mg | INTRAVENOUS | @ 22:00:00 | Stop: 2020-09-05 | NDC 67457043300

## 2020-09-05 NOTE — Progress Notes
Chemotherapy Reaction    Patient: Kristina Velazquez is a 30 y.o. female    MRN#:  [id number]    Chemotherapy Cycle 1 Day 2    Agent: carboplatin    Route: IV    Time Started: [id number]    Time of Reaction: 1649    Rate at Time of Reaction: 1659 mL/hr    Signs & Symptoms of Reaction: flushing, tachycardia and headache    Medications Administered: diphenhydramine, famotidine and methylprednisolone    Provider Notification (Name/Time): Venora Maples, APRN was notified at 1702    Time of Symptom Resolution: 1827 pt to ED    Intervention/Outcome: unresolved, pt to ED     Allergy List Updated: Yes

## 2020-09-06 ENCOUNTER — Encounter: Admit: 2020-09-06 | Discharge: 2020-09-06 | Payer: BC Managed Care – PPO

## 2020-09-06 NOTE — ED Notes
30 YO F presents to ED c/c medication reaction. Pt reports she received first dose of chemotherapy yesterday 8/8 and immediately experienced warmth from the chest up, diaphoresis, and swelling to her face. Pt reports they paused the medication and her symptoms resolved. Pt reports today 8/9 she received full dose of chemotherapy, and shortly after administration began experiencing warmth from the chest up and headache. Pt denies any SOB, difficulty swallowing, or swelling to face/mouth. Pt denies any other complaints at this time. Pt A&OX4, breathing unlabored. Bed locked in low position. Call light within reach.

## 2020-09-06 NOTE — ED Notes
Pt A&Ox4 and given discharge instructions and follow up information. Pt verbalizes understanding and has no further questions or complaints. VSS. Pt ambulated out of ED w/ steady gait and with spouse to go home by POV.

## 2020-09-06 NOTE — Telephone Encounter
Informed patient of plan for inpatient desense on 8/29 and 8/30. She is agreeable to this and all questions were answered. Instructed to call if she has any further questions.

## 2020-09-06 NOTE — ED Provider Notes
Kristina Velazquez is a 30 y.o. female.    Chief Complaint:  Chief Complaint   Patient presents with   ? Medication Reaction     Carboplatin today and 10 minutes following treatment. And felt hot from the chest up and HA.        History of Present Illness:  Kristina Velazquez is a 30 y.o. female with a past medical history of stage IIIc ovarian cancer recently diagnosed who presents to the emergency department with flushing and headache.  She was getting her first infusion of carboplatin today and a few minutes into the infusion she started getting flushing from the top of the chest up to her head, feeling warm, headache, and lip tingling.  She did not experience a rash or trouble breathing.  At the cancer clinic they gave her steroids, Benadryl, Pepcid, Tylenol.  The lip tingling stopped but her headache is still a 9 out of 10 and she still has warmth and flushing from the chest up.  The headache is frontal across her forehead but also in the left parietal region.  She has had migraines in the past but states this does not feel like them.  She is not having any auras, vision changes, numbness tingling, or weakness.  Denies fever, chills, chest pain, shortness of breath, diarrhea, vomiting, nausea, and abdominal pain.          Review of Systems:  Review of Systems   Constitutional: Negative for chills and fever.   HENT: Negative for facial swelling and sore throat.    Eyes: Negative for visual disturbance.   Respiratory: Negative for cough, chest tightness, shortness of breath, wheezing and stridor.    Cardiovascular: Negative for chest pain, palpitations and leg swelling.   Gastrointestinal: Negative for abdominal pain, diarrhea, nausea and vomiting.   Genitourinary: Negative for dysuria.   Musculoskeletal: Negative for back pain and joint swelling.   Skin: Positive for color change (Redness/flushing of neck, face, and top of chest). Negative for rash.   Neurological: Positive for headaches. Negative for weakness, light-headedness and numbness.   Psychiatric/Behavioral: Negative for agitation and confusion.       Allergies:  Paclitaxel, Peanut, Carboplatin, and Hydrocodone    Past Medical History:  Medical History:   Diagnosis Date   ? Inappropriate sinus tachycardia    ? Ovarian cancer [facility])        Past Surgical History:  Surgical History:   Procedure Laterality Date   ? HX SHOULDER SURGERY Right 2008   ? HX WISDOM TEETH EXTRACTION  2014   ? placement of port-a-cath with fluoroscopy Left 08/25/2020    Performed by Freund, Alecia Lemming., MD at [facility] OR   ? FLUOROSCOPIC GUIDANCE CENTRAL VENOUS ACCESS DEVICE PLACEMENT/ REPLACEMENT/ REMOVAL Left 08/25/2020    Performed by Freund, Alecia Lemming., MD at [facility] OR   ? HX CHOLECYSTECTOMY         Pertinent medical/surgical history reviewed    Social History:  Social History     Tobacco Use   ? Smoking status: Never Smoker   ? Smokeless tobacco: Never Used   Substance Use Topics   ? Alcohol use: Yes     Comment: OCASSIONALLY    ? Drug use: Never     Social History     Substance and Sexual Activity   Drug Use Never             Family History:  Family History   Problem Relation Age  of Onset   ? Cancer-Breast Mother 74   ? Cancer-Ovarian Mother 66   ? Heart Disease Mother    ? Depression Mother    ? Cancer Father 27   ? Hypertension Father    ? None Reported Sister    ? None Reported Brother    ? Unknown to Patient Maternal Aunt    ? None Reported Maternal Uncle    ? Cancer-Lung Paternal Aunt    ? Unknown to Patient Paternal Uncle    ? Hypertension Maternal Grandmother    ? Arthritis-osteo Maternal Grandmother    ? Migraines Maternal Grandmother    ? Depression Maternal Grandmother    ? Heart Disease Maternal Grandfather    ? Hypertension Maternal Grandfather    ? Cancer-Breast Paternal Grandmother 77   ? Hypertension Paternal Grandfather    ? Stroke Paternal Grandfather        Vitals:  ED Vitals    Date and Time T BP P RR SPO2P SPO2 User   09/05/20 2130 -- 114/70 -- -- 72 93 % MA   09/05/20 2030 -- 131/81 -- -- 81 95 % MA   09/05/20 1922 -- -- -- -- 89 95 % SF   09/05/20 1921 -- 132/87 -- -- -- -- SF   09/05/20 1844 36.8 ?C (98.3 ?F) 136/89 -- 20 PER MINUTE 105 99 % MN          Physical Exam:  Physical Exam  Vitals and nursing note reviewed.   Constitutional:       General: She is not in acute distress.     Appearance: Normal appearance. She is normal weight.   HENT:      Head: Normocephalic and atraumatic.      Mouth/Throat:      Mouth: Mucous membranes are moist.      Pharynx: Oropharynx is clear.      Comments: Non-edematous, non-erythematous   Eyes:      Extraocular Movements: Extraocular movements intact.      Conjunctiva/sclera: Conjunctivae normal.   Cardiovascular:      Rate and Rhythm: Normal rate and regular rhythm.      Pulses: Normal pulses.      Heart sounds: Normal heart sounds.   Pulmonary:      Effort: Pulmonary effort is normal. No respiratory distress.      Breath sounds: Normal breath sounds. No stridor. No wheezing.   Abdominal:      General: Abdomen is flat. Bowel sounds are normal. There is no distension.      Palpations: Abdomen is soft.      Tenderness: There is no abdominal tenderness. There is no guarding.   Musculoskeletal:         General: No swelling. Normal range of motion.      Cervical back: Neck supple.      Right lower leg: No edema.      Left lower leg: No edema.   Skin:     General: Skin is warm.      Findings: Erythema (Flushing/redness of top of chest, neck, and face that is warmer than the rest of the skin) present.      Comments: Patient not having any itching or rashes   Neurological:      General: No focal deficit present.      Mental Status: She is alert and oriented to person, place, and time. Mental status is at baseline.      Sensory: No sensory deficit.  Psychiatric:         Mood and Affect: Mood normal.         Behavior: Behavior normal.         Laboratory Results:  Labs Reviewed - No data to display       Radiology Interpretation:    No orders to display         EKG:      ED Course:  Patient is a 30 y.o. female presenting with the chief complaint of medication reaction.  Pt seen and evaluated by resident and attending at bedside.  Previous records reviewed.   Patient's initial vitals reviewed.   PIV placed, patient on monitor relaxing in bed with her husband at bedside.  Course  -Physical exam significant for the findings above  - Patient having significant headache 8/10 pain that started with infusion of her carboplatin today and flushing of skin of neck and head.  -Patient was already given steroids, pepcid, and benadryl in the cancer clinic prior to arrival.   -Patient given a migraine cocktail of Tylenol, Compazine, and magnesium.  - On reassessment, flushing has improved but still present, and patient is asleep.  - Patient was checked on again and woken up, she states her headache is gone and she would like to leave.   -Patient is safe for discharge as her vitals are stable, she is breathing normally and her headache is no longer there.  -Nurse was notified that the patient's port needs to be de-accessed prior to leaving.  - Return precautions discussed and patient was instructed to follow-up with her oncologist regarding this episode.       Disposition: Discharge             ED Scoring:                                MDM  Reviewed: previous chart, nursing note and vitals  Reviewed previous: labs        Facility Administered Meds:  Medications   heparin lock flush PF syringe 500 Units (has no administration in time range)   magnesium sulfate   1 g/D5W 100 mL IVPB (0 g Intravenous Infusion Stopped 09/05/20 2128)   prochlorperazine (COMPAZINE) injection 10 mg (10 mg Intravenous Given 09/05/20 2018)   acetaminophen (TYLENOL) tablet 650 mg (650 mg Oral Given 09/05/20 2018)   lactated ringers infusion (0 mL Intravenous Infusion Stopped 09/05/20 2135)       Active Problem List/Diagnosis Related to Current Presentation: Ovarian Cancer Stage 3C, Headache, Flushing, Medication reaction to Carboplatin    Clinical Impression:  Clinical Impression   Adverse effect of drug, initial encounter   Acute nonintractable headache, unspecified headache type       Disposition/Follow up  ED Disposition     ED Disposition    Discharge        Jerene Dilling, MD  [address]  [place] [address]  [phone]    Call   As needed, If symptoms worsen      Medications:  New Prescriptions    No medications on file       Procedure Notes:  Procedures      Attestation / Supervision:      Sharion Balloon, MD  Emergency Medicine, PGY-1

## 2020-09-06 NOTE — Telephone Encounter
Patient called to ask about scheduling her CT scan at her place of employment. I let her know that we would prefer her to complete her CT scan at [facility], and offered to try to make her CT scan appt on the same day as her planned appointment on 9/28 with Dr. Alvan Dame to decrease the amount of trips she has to take to [facility]. She was agreeable to this and had no further questions or concerns.

## 2020-09-07 ENCOUNTER — Observation Stay: Admit: 2020-09-07 | Discharge: 2020-09-07 | Payer: BC Managed Care – PPO

## 2020-09-07 ENCOUNTER — Encounter: Admit: 2020-09-07 | Discharge: 2020-09-07 | Payer: BC Managed Care – PPO

## 2020-09-07 DIAGNOSIS — C569 Malignant neoplasm of unspecified ovary: Secondary | ICD-10-CM

## 2020-09-07 MED ORDER — SODIUM CHLORIDE 0.9 % IV SOLP
500 mL | Freq: Once | INTRAVENOUS | 0 refills
Start: 2020-09-07 — End: ?

## 2020-09-07 MED ORDER — DEXAMETHASONE IVPB
20 mg | Freq: Once | INTRAVENOUS | 0 refills
Start: 2020-09-07 — End: ?

## 2020-09-07 MED ORDER — PACLITAXEL DESENSITIZATION IVPB (NON-DEHP/PVC)(SPEC VOL) BAG A
3.8 mg | Freq: Once | INTRAVENOUS | 0 refills
Start: 2020-09-07 — End: ?

## 2020-09-07 MED ORDER — FAMOTIDINE (PF) 20 MG/2 ML IV SOLN
20 mg | Freq: Once | INTRAVENOUS | 0 refills
Start: 2020-09-07 — End: ?

## 2020-09-07 MED ORDER — APREPITANT 7.2 MG/ML IV EMUL
130 mg | Freq: Once | INTRAVENOUS | 0 refills
Start: 2020-09-07 — End: ?

## 2020-09-07 MED ORDER — PACLITAXEL DESENSITIZATION IVPB (NON-DEHP/PVC)(SPEC VOL) FINAL BAG
175 mg/m2 | Freq: Once | INTRAVENOUS | 0 refills
Start: 2020-09-07 — End: ?

## 2020-09-07 MED ORDER — PACLITAXEL DESENSITIZATION IVPB (NON-DEHP/PVC)(SPEC VOL) BAG B
37.6 mg | Freq: Once | INTRAVENOUS | 0 refills
Start: 2020-09-07 — End: ?

## 2020-09-07 MED ORDER — CARBOPLATIN DESENSITIZATION IVPB (SPEC VOL)(AUC-GOG) FINAL BAG
900 mg | Freq: Once | INTRAVENOUS | 0 refills
Start: 2020-09-07 — End: ?

## 2020-09-07 MED ORDER — DIPHENHYDRAMINE HCL 50 MG/ML IJ SOLN
50 mg | Freq: Once | INTRAVENOUS | 0 refills
Start: 2020-09-07 — End: ?

## 2020-09-07 MED ORDER — CARBOPLATIN DESENSITIZATION IVPB (SPEC VOL) BAG B
90 mg | Freq: Once | INTRAVENOUS | 0 refills
Start: 2020-09-07 — End: ?

## 2020-09-07 MED ORDER — CARBOPLATIN DESENSITIZATION IVPB (SPEC VOL) BAG A
9 mg | Freq: Once | INTRAVENOUS | 0 refills
Start: 2020-09-07 — End: ?

## 2020-09-07 NOTE — Progress Notes
Plan: lodging    Intervention: SW notified by RN, Kristina Velazquez, of pt needing lodging due to having to have chemo while hospitalized on 8/29.   SW spoke to pt via phone. Pt is unsure if she will be hospitalized overnight on 8/29 or if she will be dc'd then back on 8/30. SW explained [facility] including caregiver and vaccination policy. Pt and her spouse are both fully vaccinated. Pt answered all questions for [facility] referral. SW emailed [facility] referral for pt's lodging on 09/25/20. [facility] to contact pt to confirm reservation.     [place], LMSW

## 2020-09-11 ENCOUNTER — Inpatient Hospital Stay: Admit: 2020-09-11 | Discharge: 2020-09-11 | Payer: BC Managed Care – PPO

## 2020-09-11 ENCOUNTER — Encounter: Admit: 2020-09-11 | Discharge: 2020-09-11 | Payer: BC Managed Care – PPO

## 2020-09-11 DIAGNOSIS — C569 Malignant neoplasm of unspecified ovary: Secondary | ICD-10-CM

## 2020-09-11 NOTE — Telephone Encounter
Received request from [facility] information stating that they are needing patient's ICD-10 diagnosis code and/or most recent office note in order to proceed with molecular testing. This RN faxed office note and provided ICD-10 code to the fax number provided. Fax confirmatio received.

## 2020-09-11 NOTE — Telephone Encounter
Kristina Velazquez   [id number]  Physician:  Lynnell Grain, MD  Diagnosis:  Ovarian Cancer  Surgery:  TAH BSO debulkin as HIPEC combo with Dr. Alvan Dame  Date of surgery: 11/14/20    Preop check list:    '[x]'$  Preop appointment:  Monday, 10/30/20 with Dr. Martin Majestic                             '[x]'$  [place] appointment:  pre anesthesia phone call  '[]'$  Bowel prep:   pending          '[]'$  Medical clearance:  N/A      '[]'$  Cardiology clearance:  N/A  '[x]'$  Other:  Post-op with Dr. Martin Majestic on 12/01/20

## 2020-09-18 ENCOUNTER — Encounter: Admit: 2020-09-18 | Discharge: 2020-09-18 | Payer: BC Managed Care – PPO

## 2020-09-22 ENCOUNTER — Encounter: Admit: 2020-09-22 | Discharge: 2020-09-22 | Payer: BC Managed Care – PPO

## 2020-09-22 DIAGNOSIS — C569 Malignant neoplasm of unspecified ovary: Secondary | ICD-10-CM

## 2020-09-22 DIAGNOSIS — R000 Tachycardia, unspecified: Secondary | ICD-10-CM

## 2020-09-22 NOTE — Progress Notes
[facility]   Genetic Counseling  Date: 09/22/2020  Kristina Velazquez  [place]# [id number]  DOB November 13, 1990    Dx Codes: C56.9, Z84.81, Z80.3, Z80.41, Z80.51    Obtained patient?s verbal consent to provide this telehealth visit due to the [facility]    Kristina Velazquez, age 30 y.o., was seen in telemedicine clinic today for genetic consultation regarding her personal and family history of cancer.     Clinical History: [name] was diagnosed with ovarian cancer at 30 years of age, her ovaries and uterus are currently intact, she has never had a colonoscopy screening, she had a breast ultrasound around 30 years of age for dense tissue.      Family History provided:  - Maternal:  Mother passed away at 32 years of age, she was diagnosed with breast cancer at 30 years of age and peritoneal cancer at 30 years of age, she pursued genetic testing around 45 which found a pathogenic BRCA1 mutation.   Aunts / Uncles / Cousins: Two aunts who are 30 and 50 years of age, both tested negative for the familial pathogenic BRCA1 mutation.   Grandparents: Grandmother passed away at 5 years of age with no known cancer history. Grandfather passed away at 40 years of age with no known cancer history, one of his brothers (Kritsin's great uncle) passed away in his 41's with prostate cancer, one of his sisters (Shila's great aunt) tested positive for a BRCA mutation (results unavailable for review), three of grandfather's nieces (Dayanara's first cousins once removed) tested positive for a BRCA mutation.   Ancestry: German/Cherokee    no known Jewish ancestry  - Paternal:  Father is 52 years of age, he was diagnosed with kidney cancer at 30 years of age, he was diagnosed with bladder cancer at 23 years of age.   Aunts / Uncles / Cousins: One aunt and one uncle are in their 71's with no known cancer histories. Two half aunts (father's paternal half sister and maternal half sister) are in their 51's with no known cancer histories. No cousins with known cancer histories.   Grandparents: Gearldine Shown is 65 years of age, she was diagnosed with breast cancer at 20 years of age. Grandfather passed away in his 2s with no known cancer history.   Ancestry: German/Cherokee/Irish    no known Jewish ancestry  - Siblings (2):  Brother is 48 years of age with no known cancer history has not pursued genetic testing. Sister is 18 years of age with no known cancer history, her ovaries and uterus are intact, she has not pursued genetic testing.    - no children       Discussion:  We discussed hereditary cancer syndromes involving cancers in the family.  Red flags for hereditary syndromes include bilateral / multifocal disease, young age of onset (often <50 yrs), and 2-3 relatives with the same, or related conditions.    We discussed that individuals with a pathogenic BRCA1 mutation have an increased risk for female breast and ovarian cancers in their lifetimes. Based on the family history, Korrine is at 1 in 2 (50%) chance of having inherited the familial pathogenic BRCA1 mutation.     We discussed the NCCN guidelines version 2.2022 regarding genetic testing for hereditary breast cancer syndromes. Based on her personal history of ovarian cancer Seaira does meet the NCCN guidelines to pursue genetic testing.         Based on the family history of early onset kidney cancer,  testing for multiple genes for cancer susceptibility was recommended (a genetic panel).  IF a gene from the panel is found, this would affect medical management including surveillance for early detection and treatment decisions.    The discussion included an explanation of the different types of results which may be reported (positive / negative / or variant of unknown significance).  We discussed the various syndromes and diseases associated with the genes on the panel.     Our discussion included the patient's increased risk status, how genes affect cancer susceptibility, potential benefits, risk, and limitations of testing, and methods for early detection and prevention.    Plan:  Blood will be drawn and sent to [facility] for [facility] Panel: 23 genes: ATM, BARD1, BRCA1, BRCA2, BRIP1, CDH1, CHEK2, DICER1, EPCAM, MLH1, MSH2, MSH6, NBN, NF1, PALB2, PMS2, PTEN, RAD51C, RAD51D, RECQL, SMARCA4, STK11 and TP53. We added on the [facility] Renal Next Panel: 20 genes with susceptibility to renal cancer including: BAP1, EPCAM, CHEK2, FH, FLCN, MET, MITF, MLH1, MSH2, MSH6, PMS2, PTEN, SDHA, SDHB, SDHC, SDHD, TP53, TSC1, TSC2, and VHL. Full gene sequencing and deletion duplication analysis is performed on 22 genes, EPCAM is deletion/duplication only testing.    Time:  60 minutes.  Obtained and reviewed family history.  Discussed hereditary cancer syndromes, autosomal dominant inheritance / other inheritance as relevant, presymptomatic surveillance, management and treatment.  Testing outcomes, including possibility of uncertain variants, strategies for testing other family members if a gene alteration is found. Insurance information and out of pocket price discussed. Completed paperwork including consent for genetic testing.      Salvatore Marvel, MS [facility]  Certified Genetic Counselor  [facility]  [address]  [address] c/o Genetics team  [place], [place] [address]    Email: [email or web address]  Telephone [phone]  Fax: [phone]  Appointments: [phone]      Pending:  Follow-up for DNA test results and interpretation will be once results are obtained.  Additional testing on family members will be pursued as appropriate.

## 2020-09-25 ENCOUNTER — Encounter: Admit: 2020-09-25 | Discharge: 2020-09-25 | Payer: BC Managed Care – PPO

## 2020-09-25 DIAGNOSIS — R432 Parageusia: Secondary | ICD-10-CM

## 2020-09-25 DIAGNOSIS — C569 Malignant neoplasm of unspecified ovary: Secondary | ICD-10-CM

## 2020-09-25 DIAGNOSIS — R000 Tachycardia, unspecified: Secondary | ICD-10-CM

## 2020-09-25 DIAGNOSIS — Z5111 Encounter for antineoplastic chemotherapy: Secondary | ICD-10-CM

## 2020-09-25 DIAGNOSIS — G893 Neoplasm related pain (acute) (chronic): Secondary | ICD-10-CM

## 2020-09-25 MED ORDER — NALOXONE 4 MG/ACTUATION NA SPRY
0 refills | Status: SS | PRN
Start: 2020-09-25 — End: ?

## 2020-09-25 MED ORDER — OXYCODONE 5 MG PO TAB
5 mg | ORAL_TABLET | ORAL | 0 refills | Status: SS | PRN
Start: 2020-09-25 — End: ?

## 2020-09-25 MED ADMIN — SODIUM CHLORIDE 0.9% INJ BAG (NON-DEHP/PVC) [211355]: 37.6 mg | INTRAVENOUS | @ 21:00:00 | Stop: 2020-09-25 | NDC 00338954304

## 2020-09-25 MED ADMIN — PACLITAXEL 6 MG/ML IV CONC [31025]: 376.248 mg | INTRAVENOUS | @ 23:00:00 | Stop: 2020-09-26 | NDC 00703321601

## 2020-09-25 MED ADMIN — FAMOTIDINE (PF) 20 MG/2 ML IV SOLN [166077]: 20 mg | INTRAVENOUS | @ 19:00:00 | Stop: 2020-09-25 | NDC 70860075141

## 2020-09-25 MED ADMIN — PACLITAXEL 6 MG/ML IV CONC [31025]: 3.8 mg | INTRAVENOUS | @ 20:00:00 | Stop: 2020-09-25 | NDC 00703321601

## 2020-09-25 MED ADMIN — SODIUM CHLORIDE 0.9% INJ BAG (NON-DEHP/PVC) [211355]: 376.248 mg | INTRAVENOUS | @ 23:00:00 | Stop: 2020-09-26 | NDC 00338954304

## 2020-09-25 MED ADMIN — DEXAMETHASONE SODIUM PHOSPHATE 10 MG/ML IJ SOLN [2331]: 20 mg | INTRAVENOUS | @ 19:00:00 | Stop: 2020-09-25 | NDC 54029032702

## 2020-09-25 MED ADMIN — PACLITAXEL 6 MG/ML IV CONC [31025]: 37.6 mg | INTRAVENOUS | @ 21:00:00 | Stop: 2020-09-25 | NDC 00703321601

## 2020-09-25 MED ADMIN — SODIUM CHLORIDE 0.9% INJ BAG (NON-DEHP/PVC) [211355]: 3.8 mg | INTRAVENOUS | @ 20:00:00 | Stop: 2020-09-25 | NDC 00338954304

## 2020-09-25 MED ADMIN — ONDANSETRON HCL (PF) 4 MG/2 ML IJ SOLN [136012]: 16 mg | INTRAVENOUS | @ 19:00:00 | Stop: 2020-09-28 | NDC 25021077702

## 2020-09-25 MED ADMIN — DIPHENHYDRAMINE HCL 50 MG/ML IJ SOLN [2508]: 50 mg | INTRAVENOUS | @ 19:00:00 | Stop: 2020-09-25 | NDC 63323066400

## 2020-09-25 MED ADMIN — SODIUM CHLORIDE 0.9 % IV SOLP [27838]: 20 mg | INTRAVENOUS | @ 19:00:00 | Stop: 2020-09-25 | NDC 00338954650

## 2020-09-25 MED ADMIN — SODIUM CHLORIDE 0.9 % IV SOLP [27838]: 16 mg | INTRAVENOUS | @ 19:00:00 | Stop: 2020-09-28 | NDC 00338954650

## 2020-09-25 NOTE — Progress Notes
Subjective     GYNECOLOGIC ONCOLOGY EVALUATION    Name:Aasiya Tanya Nones    Date: 09/25/2020    Referring & Primary Care Physician: Jerene Dilling    Chief Complaint:   Chief Complaint   Patient presents with   ? Heme/Onc Care   ? Follow Up     History of Present Illness:    Shadi Whitcomb is a 30 y.o. female with below history     Onc Timeline Overview Note   Belina Rumler is a 30 y.o. female patient of Dr. 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 Patient presents to clinic today for evaluation prior to C2 Carbo/Taxol (via inpatient desensitization due to reactions with both Carbo and Taxol with C1).     Patient presents today with the following complaints. Fri - Sun post treatment experienced diarrhea. Drank electrolyte replacement drinks, did not take any OTC antidiarrheals. Reported bone pain in knees that started on Sat post treatment that lasted 3-4 days, took tramadol, attempted applying heat and taking advil and tylenol without significant impact on her symptoms. Ranks leg pain at a 8-9 in intensity. Decreased appetite and metallic taste change x 1 week post treatment, was able to tolerate oyster crackers and cottage cheese well.     She is trying to hydrate well and her energy level is ok. She denies chest pain, SOA, cough, abdominal pain/bloating, vomiting, constipation, hematuria, hematochezia, vaginal bleeding/discharge, urinary frequency/urgency/dysuria, LE edema, fevers/chills and peripheral neuropathy.       Ovarian cancer (HCC)   08/08/2020 Imaging    CT scan A/P demonstrated: Ill-defined complex pelvic mass with omental and mesenteric masses with intra-abdominal ascites suggesting a neoplastic process with metastatic disease. No other intraparenchymal metastases    CA125 = 862     08/16/2020 Biopsy    High grade serous carcinoma. ER 85%, PR 10%, positive PAX8, WT-1, p53     09/04/2020 -  Chemotherapy    Had reaction to both taxol and carbo in C1, plan for inpatient C2  OP GYN CARBOPLATIN (desensitization) + PACLITAXEL (desensitization) (OVARIAN)  Plan Provider: Eileen Stanford, MD  Treatment goal: Curative   Line of treatment: First Line       Reproductive History  Menstrual Hx               LMP: 07/22/20               Having Periods:  Yes               Age at first period: 7  ?  Pregnancy Hx               Number of pregnancies: None               Number of live births:                Age of first live birth:                Did you breastfeed: No                            If Yes, how long?                Oral Birth Control:  Yes               Years: 8-9 years  Infertility Medication:  No               Year/Med Name:   ?  Menopausal Hx               Age of last period: N/A               Hormone Replacement Therapy:                 Years:   ?  Health Maintenence  Last Pap: 2021  Abn History of Pap: Denies  Colonoscopy: Denies  Mammogram: Denies  Bone scan: Denies  ?  Advanced Directives   DPOA: No  Living Will: No  ?  Past Medical History:  Medical History:   Diagnosis Date   ? Inappropriate sinus tachycardia    ? Ovarian cancer [facility] 2)      Past Surgical History:  Surgical History:   Procedure Laterality Date   ? HX SHOULDER SURGERY Right 2008   ? HX WISDOM TEETH EXTRACTION  2014   ? placement of port-a-cath with fluoroscopy Left 08/25/2020    Performed by Freund, Alecia Lemming., MD at [facility] OR   ? FLUOROSCOPIC GUIDANCE CENTRAL VENOUS ACCESS DEVICE PLACEMENT/ REPLACEMENT/ REMOVAL Left 08/25/2020    Performed by Freund, Alecia Lemming., MD at [facility] OR   ? HX CHOLECYSTECTOMY       Medications:    Current Outpatient Medications:   ?  ALPRAZolam (XANAX) 1 mg tablet, Take 1 mg by mouth daily as needed for Anxiety., Disp: , Rfl:   ?  amphetamine-dextroamphetamine XR (ADDERALL XR) 30 mg capsule, Take 30 mg by mouth twice daily., Disp: , Rfl:   ?  dexAMETHasone (DECADRON) 4 mg tablet, Take two tablets by mouth daily. On Days 2-4 of each cycle., Disp: 36 tablet, Rfl: 0  ? HYDROcodone/acetaminophen (NORCO) 5/325 mg tablet, Take one tablet by mouth every 4 hours as needed. Indications: pain, Disp: 6 tablet, Rfl: 0  ?  ibuprofen (ADVIL) 200 mg tablet, Take 200-400 mg by mouth every 8 hours as needed for Pain. Take with food., Disp: , Rfl:   ?  lidocaine/prilocaine (EMLA) 2.5/2.5 % topical cream, Apply  topically to affected area as Needed. Apply 30-60 minutes prior to port access., Disp: 30 g, Rfl: 0  ?  MULTIVITAMIN PO, Take 1 tablet by mouth daily., Disp: , Rfl:   ?  naloxone (NARCAN) 4 mg/actuation nasal spray, Insert 1 spray into 1 nostril as needed for signs of opioid overdose then call 911. May repeat dose every 2-3 minutes (alternate nostrils) until medical team arrives.  Indications: opioid overdose, Disp: 2 each, Rfl: 0  ?  ondansetron HCL (ZOFRAN) 8 mg tablet, Take one tablet by mouth every 8 hours as needed (nausea and vomiting)., Disp: 30 tablet, Rfl: 0  ?  oxyCODONE (ROXICODONE) 5 mg tablet, Take one tablet by mouth every 6 hours as needed for Pain., Disp: 30 tablet, Rfl: 0  ?  pantoprazole DR (PROTONIX) 20 mg tablet, Take 20 mg by mouth daily., Disp: , Rfl:   ?  prochlorperazine maleate (COMPAZINE) 10 mg tablet, Take one tablet by mouth every 6 hours as needed for Nausea or Vomiting., Disp: 30 tablet, Rfl: 0  ?  traMADoL (ULTRAM) 50 mg tablet, Take 50 mg by mouth every 8 hours as needed for Pain., Disp: , Rfl:     Allergies:  Allergies   Allergen Reactions   ? Paclitaxel CHEST TIGHTNESS, VISION CHANGES and REDNESS   ?  Peanut RASH   ? Carboplatin FLUSHING (SKIN), HEADACHE and REDNESS   ? Hydrocodone STOMACH UPSET     Social History:  Social History     Socioeconomic History   ? Marital status: Married   Tobacco Use   ? Smoking status: Never Smoker   ? Smokeless tobacco: Never Used   Substance and Sexual Activity   ? Alcohol use: Yes     Comment: OCASSIONALLY    ? Drug use: Never     Family History:  Family History   Problem Relation Age of Onset   ? Genetic Disorder Mother BRCA1+   ? Cancer-Breast Mother 87   ? Cancer-Ovarian Mother 9   ? Heart Disease Mother    ? Depression Mother    ? Kidney Cancer Father 31   ? Hypertension Father    ? None Reported Sister    ? None Reported Brother    ? Unknown to Patient Paternal Uncle    ? Hypertension Maternal Grandmother    ? Arthritis-osteo Maternal Grandmother    ? Migraines Maternal Grandmother    ? Depression Maternal Grandmother    ? Heart Disease Maternal Grandfather    ? Hypertension Maternal Grandfather    ? Cancer-Breast Paternal Grandmother 51   ? Hypertension Paternal Grandfather    ? Stroke Paternal Grandfather      REVIEW OF SYSTEMS:        CONSTITUTIONAL: Negative unless stated in HPI  EYES: Negative unless stated in HPI  ENT: Negative unless stated in HPI  RESPIRATORY: Negative unless stated in HPI  CARDIOVASCULAR: Negative unless stated in HPI  GI: Negative unless stated in HPI  GU: Negative unless stated in HPI  MUSCULO-SKELETAL: Negative unless stated in HPI  SKIN: Negative unless stated in HPI  ENDOCRINE: Negative unless stated in HPI  HEMATOLOGIC: Negative unless stated in HPI    ECOG 1    Physical Exam:    BP (!) 124/94 (BP Source: Arm, Right Upper)  - Pulse 107  - Temp 36.1 ?C (96.9 ?F) (Temporal)  - Resp 18  - Ht 173 cm (5' 8.11)  - Wt 92.8 kg (204 lb 9.6 oz)  - LMP 08/17/2020  - SpO2 100%  - BMI 31.01 kg/m?   GENERAL APPEARANCE: Appears healthy.  Alert; in no acute distress.  Pleasant, but anxious.   HEENT: Unremarkable. No tenderness or masses noted.  NECK: Neck supple. No tenderness. No adenopathy.    LUNGS: Chest symmetrical. Good diaphragmatic excursion. Lungs clear; normal breath sounds.  CARDIOVASCULAR:  RRR. Heart sounds normal.      ABDOMEN: Soft, non-tender and non-distended. No obvious masses, organomegaly or hernia.   EXTREMITIES: Extremities normal. No joint deformities, edema, or skin discoloration. Station and gait normal.   SKIN: Skin color, texture, turgor normal. No rashes or lesions.    CBC w/Diff    Lab Results   Component Value Date/Time    WBC 9.8 09/25/2020 09:09 AM    RBC 4.58 09/25/2020 09:09 AM    HGB 12.7 09/25/2020 09:09 AM    HCT 37.9 09/25/2020 09:09 AM    MCV 82.7 09/25/2020 09:09 AM    MCH 27.7 09/25/2020 09:09 AM    MCHC 33.4 09/25/2020 09:09 AM    RDW 13.4 09/25/2020 09:09 AM    PLTCT 280 09/25/2020 09:09 AM    MPV 6.6 (L) 09/25/2020 09:09 AM    Lab Results   Component Value Date/Time    NEUT 69 09/25/2020 09:09 AM  ANC 6.80 09/25/2020 09:09 AM    LYMA 21 (L) 09/25/2020 09:09 AM    ALC 2.10 09/25/2020 09:09 AM    MONA 9 09/25/2020 09:09 AM    AMC 0.90 (H) 09/25/2020 09:09 AM    EOSA 0 09/25/2020 09:09 AM    AEC 0.00 09/25/2020 09:09 AM    BASA 1 09/25/2020 09:09 AM    ABC 0.00 09/25/2020 09:09 AM          Comprehensive Metabolic Profile    Lab Results   Component Value Date/Time    NA 138 09/25/2020 09:09 AM    K 4.1 09/25/2020 09:09 AM    CL 105 09/25/2020 09:09 AM    CO2 25 09/25/2020 09:09 AM    GAP 8 09/25/2020 09:09 AM    BUN 8 09/25/2020 09:09 AM    CR 0.75 09/25/2020 09:09 AM    GLU 107 (H) 09/25/2020 09:09 AM    Lab Results   Component Value Date/Time    CA 9.0 09/25/2020 09:09 AM    ALBUMIN 4.0 09/25/2020 09:09 AM    TOTPROT 7.0 09/25/2020 09:09 AM    ALKPHOS 52 09/25/2020 09:09 AM    AST 20 09/25/2020 09:09 AM    ALT 25 09/25/2020 09:09 AM    TOTBILI 0.2 (L) 09/25/2020 09:09 AM          Other labs    No results found for: ESR No results found for: LDH     Lab Results   Component Value Date    CA125 1,797 (H) 09/04/2020          ASSESSMENT/PLAN:  Vaibhavi Czaplewski is a 30 y.o. 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 Patient presents to clinic today for evaluation prior to C2 Carbo/Taxol (via inpatient desensitization due to reactions with both Carbo and Taxol with C1).   ? Leg Pain: Encourage acetaminophen 1000 g q6hr PRN and ibuprofen 800 mg q8hr PRN. For pain > 6/10 in intensity, prescribing oxycodone 5 mg to take q4hr PRN. Encourage application of heat or ice as tolerated. Rest and elevate.   ? Decreased appetite: Encourage small/frequent meals instead of 3 larger meals/day. Goal for 80 g of protein per day, may take protein shakes/snacks as supplements.   ? Metallic taste: Avoid metal flatware, encourage plasticware instead. May do better with citrus flavored items.   ? Diarrhea: Recommend patient initiate Imodium - 2 tabs after the first loose stool of the day followed by one tab every loose stool thereafter. She should not take more than 8 tabs in a 24 hour period.   ? CA125 is pending.   ? UHcg is negative.   ? Otherwise, CBC, CMP, vitals and physical exam are appropriate to proceed with treatment today.   ? Weekly labs at [facility].   ? Lab, RV and outpatient desens with C3 if patient tolerates C2 inpatient without reaction.     Ernest Haber Mosetta Ferdinand, APRN-NP       Total time spent date of service: 35 minutes, which was spent in preparation for patient visit, review of pertinent history, labs and imaging, coordination of patient care, education and counseling of patient. Discussed the patient's diagnosis, management, and reviewed goals.

## 2020-09-26 MED ADMIN — CARBOPLATIN 10 MG/ML IV SOLN [39267]: 9 mg | INTRAVENOUS | @ 17:00:00 | Stop: 2020-09-26 | NDC 55150033501

## 2020-09-26 MED ADMIN — SODIUM CHLORIDE 0.9 % IV SOLP [27838]: 20 mg | INTRAVENOUS | @ 15:00:00 | Stop: 2020-09-26 | NDC 00338004931

## 2020-09-26 MED ADMIN — APREPITANT 7.2 MG/ML IV EMUL [335152]: 130 mg | INTRAVENOUS | @ 16:00:00 | Stop: 2020-09-26 | NDC 47426020101

## 2020-09-26 MED ADMIN — IBUPROFEN 400 MG PO TAB [3843]: 600 mg | ORAL | @ 01:00:00 | NDC 60687044611

## 2020-09-26 MED ADMIN — SODIUM CHLORIDE 0.9 % IV SOLP [27838]: 500 mL | INTRAVENOUS | @ 14:00:00 | Stop: 2020-09-26 | NDC 00338004903

## 2020-09-26 MED ADMIN — IBUPROFEN 200 MG PO TAB [3841]: 600 mg | ORAL | @ 12:00:00 | Stop: 2020-09-27 | NDC 00904791461

## 2020-09-26 MED ADMIN — SODIUM CHLORIDE 0.9 % IV SOLP [27838]: 9 mg | INTRAVENOUS | @ 17:00:00 | Stop: 2020-09-26 | NDC 00338004904

## 2020-09-26 MED ADMIN — POTASSIUM CHLORIDE IN 0.9%NACL 20 MEQ/L IV SOLP [16426]: 1000 mL | INTRAVENOUS | @ 22:00:00 | Stop: 2020-09-26 | NDC 00338069104

## 2020-09-26 MED ADMIN — DIPHENHYDRAMINE HCL 50 MG/ML IJ SOLN [2508]: 50 mg | INTRAVENOUS | @ 16:00:00 | Stop: 2020-09-26 | NDC 63323066400

## 2020-09-26 MED ADMIN — SODIUM CHLORIDE 0.9 % IV SOLP [27838]: 900 mg | INTRAVENOUS | @ 19:00:00 | Stop: 2020-09-26 | NDC 00338004904

## 2020-09-26 MED ADMIN — CARBOPLATIN 10 MG/ML IV SOLN [39267]: 900 mg | INTRAVENOUS | @ 19:00:00 | Stop: 2020-09-26 | NDC 55150033501

## 2020-09-26 MED ADMIN — HEPARIN, PORCINE (PF) 100 UNIT/ML IV SYRG [17362]: 500 [IU] | INTRAVENOUS_CENTRAL | Stop: 2020-09-26 | NDC 63807060055

## 2020-09-26 MED ADMIN — SODIUM CHLORIDE 0.9 % IV SOLP [27838]: 90 mg | INTRAVENOUS | @ 18:00:00 | Stop: 2020-09-26 | NDC 00338004904

## 2020-09-26 MED ADMIN — MAGNESIUM SULFATE IN D5W 1 GRAM/100 ML IV PGBK [166578]: 1 g | INTRAVENOUS | @ 22:00:00 | Stop: 2020-09-26 | NDC 00338170940

## 2020-09-26 MED ADMIN — SODIUM CHLORIDE 0.9 % IV SOLP [27838]: 16 mg | INTRAVENOUS | @ 16:00:00 | Stop: 2020-09-27 | NDC 00338004931

## 2020-09-26 MED ADMIN — IBUPROFEN 200 MG PO TAB [3841]: 600 mg | ORAL | @ 01:00:00 | NDC 00904791461

## 2020-09-26 MED ADMIN — IBUPROFEN 400 MG PO TAB [3843]: 600 mg | ORAL | @ 12:00:00 | Stop: 2020-09-27 | NDC 60687044611

## 2020-09-26 MED ADMIN — CARBOPLATIN 10 MG/ML IV SOLN [39267]: 90 mg | INTRAVENOUS | @ 18:00:00 | Stop: 2020-09-26 | NDC 55150033501

## 2020-09-26 MED ADMIN — DEXAMETHASONE SODIUM PHOSPHATE 10 MG/ML IJ SOLN [2331]: 20 mg | INTRAVENOUS | @ 15:00:00 | Stop: 2020-09-26 | NDC 55150030501

## 2020-09-26 MED ADMIN — ONDANSETRON HCL (PF) 4 MG/2 ML IJ SOLN [136012]: 16 mg | INTRAVENOUS | @ 16:00:00 | Stop: 2020-09-27 | NDC 16729029805

## 2020-09-26 MED ADMIN — FAMOTIDINE (PF) 20 MG/2 ML IV SOLN [166077]: 20 mg | INTRAVENOUS | @ 16:00:00 | Stop: 2020-09-26 | NDC 70860075141

## 2020-09-29 ENCOUNTER — Encounter: Admit: 2020-09-29 | Discharge: 2020-09-29 | Payer: BC Managed Care – PPO

## 2020-09-29 DIAGNOSIS — C569 Malignant neoplasm of unspecified ovary: Secondary | ICD-10-CM

## 2020-09-29 NOTE — Telephone Encounter
CARIS results received.  Copy kept for physician review. Results being scanned into chart.

## 2020-10-03 ENCOUNTER — Encounter: Admit: 2020-10-03 | Discharge: 2020-10-03 | Payer: BC Managed Care – PPO

## 2020-10-04 ENCOUNTER — Encounter: Admit: 2020-10-04 | Discharge: 2020-10-04 | Payer: BC Managed Care – PPO

## 2020-10-04 DIAGNOSIS — C569 Malignant neoplasm of unspecified ovary: Secondary | ICD-10-CM

## 2020-10-09 ENCOUNTER — Encounter: Admit: 2020-10-09 | Discharge: 2020-10-09 | Payer: BC Managed Care – PPO

## 2020-10-10 ENCOUNTER — Encounter: Admit: 2020-10-10 | Discharge: 2020-10-10 | Payer: BC Managed Care – PPO

## 2020-10-13 ENCOUNTER — Encounter: Admit: 2020-10-13 | Discharge: 2020-10-13 | Payer: BC Managed Care – PPO

## 2020-10-13 MED ORDER — SODIUM CHLORIDE 0.9 % IV SOLP
500 mL | Freq: Once | INTRAVENOUS | 0 refills
Start: 2020-10-13 — End: ?

## 2020-10-13 MED ORDER — APREPITANT 7.2 MG/ML IV EMUL
130 mg | Freq: Once | INTRAVENOUS | 0 refills
Start: 2020-10-13 — End: ?

## 2020-10-13 MED ORDER — DEXAMETHASONE IVPB
20 mg | Freq: Once | INTRAVENOUS | 0 refills
Start: 2020-10-13 — End: ?

## 2020-10-13 MED ORDER — PALONOSETRON 0.25 MG/5 ML IV SOLN
.25 mg | Freq: Once | INTRAVENOUS | 0 refills
Start: 2020-10-13 — End: ?

## 2020-10-13 MED ORDER — DIPHENHYDRAMINE HCL 50 MG/ML IJ SOLN
50 mg | Freq: Once | INTRAVENOUS | 0 refills
Start: 2020-10-13 — End: ?

## 2020-10-13 MED ORDER — FAMOTIDINE (PF) 20 MG/2 ML IV SOLN
20 mg | Freq: Once | INTRAVENOUS | 0 refills
Start: 2020-10-13 — End: ?

## 2020-10-13 MED ORDER — PACLITAXEL DESENSITIZATION IVPB (NON-DEHP/PVC)(SPEC VOL) FINAL BAG
175 mg/m2 | Freq: Once | INTRAVENOUS | 0 refills
Start: 2020-10-13 — End: ?

## 2020-10-13 MED ORDER — PACLITAXEL DESENSITIZATION IVPB (NON-DEHP/PVC)(SPEC VOL) BAG B
37.6 mg | Freq: Once | INTRAVENOUS | 0 refills
Start: 2020-10-13 — End: ?

## 2020-10-13 MED ORDER — PACLITAXEL DESENSITIZATION IVPB (NON-DEHP/PVC)(SPEC VOL) BAG A
3.8 mg | Freq: Once | INTRAVENOUS | 0 refills
Start: 2020-10-13 — End: ?

## 2020-10-13 MED ORDER — CARBOPLATIN DESENSITIZATION IVPB (SPEC VOL) BAG B
90 mg | Freq: Once | INTRAVENOUS | 0 refills
Start: 2020-10-13 — End: ?

## 2020-10-13 MED ORDER — CARBOPLATIN DESENSITIZATION IVPB (SPEC VOL)(AUC-GOG) FINAL BAG
900 mg | Freq: Once | INTRAVENOUS | 0 refills
Start: 2020-10-13 — End: ?

## 2020-10-13 MED ORDER — CARBOPLATIN DESENSITIZATION IVPB (SPEC VOL) BAG A
9 mg | Freq: Once | INTRAVENOUS | 0 refills
Start: 2020-10-13 — End: ?

## 2020-10-16 ENCOUNTER — Encounter: Admit: 2020-10-16 | Discharge: 2020-10-16 | Payer: BC Managed Care – PPO

## 2020-10-16 DIAGNOSIS — C569 Malignant neoplasm of unspecified ovary: Secondary | ICD-10-CM

## 2020-10-16 DIAGNOSIS — T451X5S Adverse effect of antineoplastic and immunosuppressive drugs, sequela: Secondary | ICD-10-CM

## 2020-10-16 MED ORDER — CARBOPLATIN DESENSITIZATION IVPB (SPEC VOL) BAG B
86 mg | Freq: Once | INTRAVENOUS | 0 refills
Start: 2020-10-16 — End: ?

## 2020-10-16 MED ORDER — PACLITAXEL DESENSITIZATION IVPB (NON-DEHP/PVC)(SPEC VOL) BAG B
37.6 mg | Freq: Once | INTRAVENOUS | 0 refills | Status: CP
Start: 2020-10-16 — End: ?
  Administered 2020-10-16 (×2): 37.6 mg via INTRAVENOUS

## 2020-10-16 MED ORDER — DIPHENHYDRAMINE HCL 50 MG/ML IJ SOLN
50 mg | Freq: Once | INTRAVENOUS | 0 refills | Status: CP
Start: 2020-10-16 — End: ?
  Administered 2020-10-16: 15:00:00 50 mg via INTRAVENOUS

## 2020-10-16 MED ORDER — ACETAMINOPHEN 325 MG PO TAB
650 mg | Freq: Once | ORAL | 0 refills | Status: CP
Start: 2020-10-16 — End: ?
  Administered 2020-10-16: 15:00:00 650 mg via ORAL

## 2020-10-16 MED ORDER — PACLITAXEL DESENSITIZATION IVPB (NON-DEHP/PVC)(SPEC VOL) FINAL BAG
175 mg/m2 | Freq: Once | INTRAVENOUS | 0 refills | Status: CP
Start: 2020-10-16 — End: ?
  Administered 2020-10-16 (×2): 376.248 mg via INTRAVENOUS

## 2020-10-16 MED ORDER — DEXAMETHASONE IVPB
20 mg | Freq: Once | INTRAVENOUS | 0 refills | Status: CP
Start: 2020-10-16 — End: ?
  Administered 2020-10-16 (×2): 20 mg via INTRAVENOUS

## 2020-10-16 MED ORDER — CARBOPLATIN DESENSITIZATION IVPB (SPEC VOL) BAG A
8.6 mg | Freq: Once | INTRAVENOUS | 0 refills
Start: 2020-10-16 — End: ?

## 2020-10-16 MED ORDER — PACLITAXEL DESENSITIZATION IVPB (NON-DEHP/PVC)(SPEC VOL) BAG A
3.8 mg | Freq: Once | INTRAVENOUS | 0 refills | Status: CP
Start: 2020-10-16 — End: ?
  Administered 2020-10-16 (×2): 3.8 mg via INTRAVENOUS

## 2020-10-16 MED ORDER — ONDANSETRON HCL 8 MG PO TAB
8 mg | ORAL_TABLET | ORAL | 3 refills | 8.00000 days | Status: AC | PRN
Start: 2020-10-16 — End: ?
  Filled 2020-11-20: 10d supply

## 2020-10-16 MED ORDER — ALTEPLASE 2 MG IK SOLR
2 mg | Freq: Once | INTRAMUSCULAR | 0 refills | Status: CP
Start: 2020-10-16 — End: ?
  Administered 2020-10-16: 13:00:00 2 mg via INTRAMUSCULAR

## 2020-10-16 MED ORDER — PROCHLORPERAZINE MALEATE 10 MG PO TAB
10 mg | ORAL_TABLET | ORAL | 3 refills | Status: AC | PRN
Start: 2020-10-16 — End: ?

## 2020-10-16 MED ORDER — HEPARIN, PORCINE (PF) 100 UNIT/ML IV SYRG
500 [IU] | Freq: Once | 0 refills | Status: CP
Start: 2020-10-16 — End: ?

## 2020-10-16 MED ORDER — FAMOTIDINE (PF) 20 MG/2 ML IV SOLN
20 mg | Freq: Once | INTRAVENOUS | 0 refills | Status: CP
Start: 2020-10-16 — End: ?
  Administered 2020-10-16: 15:00:00 20 mg via INTRAVENOUS

## 2020-10-16 NOTE — Progress Notes
CHEMO NOTE  Verified chemo consent signed and in chart.    Blood return positive via: Port (Single, Power Port and Accessed)    BSA and dose double checked (agree with orders as written) with: yes see MAR    Labs/applicable tests checked: CBC, Comprehensive Metabolic Panel (CMP) and UA    Chemo regimen: C3D1    PACLitaxeL (TAXOL) 3.8 mg in sodium chloride 0.9% (NS) 250 mL IVPB   PACLitaxeL (TAXOL) 37.6 mg in sodium chloride 0.9% (NS) 250 mL IVPB   PACLitaxeL (TAXOL) 376.248 mg in sodium chloride 0.9% (NS) 500 mL IVPB     Rate verified and armband double check with second RN: yes    Patient education offered and stated understanding. Denies questions at this time.      Patient arrived to [place] treatment after being seen in clinic; please refer to clinic note for assessment details. Premeds given per treatment plan. Taxol desense given w/o complications, patient tolerated well. Port positive for blood return, flushed with heparin, and remains accessed for treatment tomorrow per patient preference. Patient declined copy of labs and AVS. All questions and concerns addressed. Patient left CC treatment in stable condition.

## 2020-10-16 NOTE — Progress Notes
Clinical Nutrition Follow Up Summary    Kristina Velazquez is a 30 y.o. female with ovarian cancer, FIGO stage IIIC   Past Medical History: reviewed    Current Treatment Plan: OP GYN CARBOPLATIN + PACLITAXEL (OVARIAN)     Nutrition Assessment of Patient:  BMI Categories Adult: Obesity Class I: 30-34.9  Desired Weight: 72 kg  Estimated Calorie Needs: 2160-2520 (30-35kcal/kg DW)  Estimated Protein Needs: 94-108 (1.3-1.5g/kg DW)  Needs to promote: weight maintainence    Wt Readings from Last 5 Encounters:   10/16/20 93.1 kg (205 lb 3.2 oz)   09/26/20 91.5 kg (201 lb 12.8 oz)   09/25/20 92.8 kg (204 lb 9.6 oz)   09/05/20 93.9 kg (207 lb)   09/05/20 94.1 kg (207 lb 6.4 oz)      RD f/u with pt in tx today.  She reports appetite varies day to day. Eating what sounds good and tries to focus on protein.  Weight initially went down but is back up 4# this week.  Nutrition labs WNL.  I encouraged continued good PO efforts with focus on weight maintenance and increased protein/hydration needs.  All questions answered and I will continue to remain available PRN.    Caprice Red, MS, [place], CSO, LD   Clinical Dietitian Specialist in Oncology  Phone: [phone]

## 2020-10-17 ENCOUNTER — Encounter: Admit: 2020-10-17 | Discharge: 2020-10-17 | Payer: BC Managed Care – PPO

## 2020-10-17 DIAGNOSIS — R000 Tachycardia, unspecified: Secondary | ICD-10-CM

## 2020-10-17 DIAGNOSIS — C569 Malignant neoplasm of unspecified ovary: Secondary | ICD-10-CM

## 2020-10-17 DIAGNOSIS — T451X5S Adverse effect of antineoplastic and immunosuppressive drugs, sequela: Secondary | ICD-10-CM

## 2020-10-17 MED ORDER — FAMOTIDINE (PF) 20 MG/2 ML IV SOLN
20 mg | Freq: Once | INTRAVENOUS | 0 refills | Status: CP
Start: 2020-10-17 — End: ?
  Administered 2020-10-17: 13:00:00 20 mg via INTRAVENOUS

## 2020-10-17 MED ORDER — APREPITANT 7.2 MG/ML IV EMUL
130 mg | Freq: Once | INTRAVENOUS | 0 refills | Status: CP
Start: 2020-10-17 — End: ?
  Administered 2020-10-17: 13:00:00 130 mg via INTRAVENOUS

## 2020-10-17 MED ORDER — PALONOSETRON 0.25 MG/5 ML IV SOLN
.25 mg | Freq: Once | INTRAVENOUS | 0 refills | Status: CP
Start: 2020-10-17 — End: ?
  Administered 2020-10-17: 13:00:00 0.25 mg via INTRAVENOUS

## 2020-10-17 MED ORDER — MAGNESIUM SULFATE IN WATER 2 GRAM/50 ML (4 %) IV PGBK
2 g | Freq: Once | INTRAVENOUS | 0 refills | Status: CP
Start: 2020-10-17 — End: ?
  Administered 2020-10-17: 16:00:00 2 g via INTRAVENOUS

## 2020-10-17 MED ORDER — CARBOPLATIN DESENSITIZATION IVPB (SPEC VOL) BAG A
8.6 mg | Freq: Once | INTRAVENOUS | 0 refills | Status: CP
Start: 2020-10-17 — End: ?
  Administered 2020-10-17 (×2): 8.6 mg via INTRAVENOUS

## 2020-10-17 MED ORDER — SODIUM CHLORIDE 0.9 % IV SOLP
500 mL | Freq: Once | INTRAVENOUS | 0 refills | Status: CP
Start: 2020-10-17 — End: ?
  Administered 2020-10-17: 13:00:00 500 mL via INTRAVENOUS

## 2020-10-17 MED ORDER — HEPARIN, PORCINE (PF) 100 UNIT/ML IV SYRG
500 [IU] | Freq: Once | 0 refills | Status: CP
Start: 2020-10-17 — End: ?

## 2020-10-17 MED ORDER — POTASSIUM CHLORIDE IN 0.9%NACL 20 MEQ/L IV SOLP
1000 mL | Freq: Once | INTRAVENOUS | 0 refills | Status: CP
Start: 2020-10-17 — End: ?
  Administered 2020-10-17: 16:00:00 1000 mL via INTRAVENOUS

## 2020-10-17 MED ORDER — DIPHENHYDRAMINE HCL 50 MG/ML IJ SOLN
50 mg | Freq: Once | INTRAVENOUS | 0 refills | Status: CP
Start: 2020-10-17 — End: ?
  Administered 2020-10-17: 13:00:00 50 mg via INTRAVENOUS

## 2020-10-17 MED ORDER — DEXAMETHASONE IVPB
20 mg | Freq: Once | INTRAVENOUS | 0 refills | Status: CP
Start: 2020-10-17 — End: ?
  Administered 2020-10-17 (×2): 20 mg via INTRAVENOUS

## 2020-10-17 MED ORDER — CARBOPLATIN DESENSITIZATION IVPB (SPEC VOL)(AUC-GOG) FINAL BAG
864 mg | Freq: Once | INTRAVENOUS | 0 refills | Status: CP
Start: 2020-10-17 — End: ?
  Administered 2020-10-17 (×2): 864 mg via INTRAVENOUS

## 2020-10-17 MED ORDER — CARBOPLATIN DESENSITIZATION IVPB (SPEC VOL) BAG B
86 mg | Freq: Once | INTRAVENOUS | 0 refills | Status: CP
Start: 2020-10-17 — End: ?
  Administered 2020-10-17 (×2): 86 mg via INTRAVENOUS

## 2020-10-17 NOTE — Progress Notes
CHEMO NOTE  Verified chemo consent signed and in chart.    Blood return positive via: Port (Single)    BSA and dose double checked (agree with orders as written) with: yes      Labs/applicable tests checked: CBC and Comprehensive Metabolic Panel (CMP)    Chemo regimen: Drug/cycle/day C3D2 Carboplatin Desense    Rate verified and armband double check with second RN: yes    Patient education offered and stated understanding. Denies questions at this time.

## 2020-10-19 ENCOUNTER — Encounter: Admit: 2020-10-19 | Discharge: 2020-10-19 | Payer: BC Managed Care – PPO

## 2020-10-25 ENCOUNTER — Encounter: Admit: 2020-10-25 | Discharge: 2020-10-25 | Payer: BC Managed Care – PPO

## 2020-10-25 DIAGNOSIS — C569 Malignant neoplasm of unspecified ovary: Secondary | ICD-10-CM

## 2020-10-25 DIAGNOSIS — R000 Tachycardia, unspecified: Secondary | ICD-10-CM

## 2020-10-25 MED ORDER — SODIUM CHLORIDE 0.9 % IJ SOLN
50 mL | Freq: Once | INTRAVENOUS | 0 refills | Status: CP
Start: 2020-10-25 — End: ?
  Administered 2020-10-25: 19:00:00 50 mL via INTRAVENOUS

## 2020-10-25 MED ORDER — HEPARIN, PORCINE (PF) 100 UNIT/ML IV SYRG
500 [IU] | Freq: Once | 0 refills | Status: CP
Start: 2020-10-25 — End: ?

## 2020-10-25 MED ORDER — IOHEXOL 350 MG IODINE/ML IV SOLN
100 mL | Freq: Once | INTRAVENOUS | 0 refills | Status: CP
Start: 2020-10-25 — End: ?
  Administered 2020-10-25: 19:00:00 100 mL via INTRAVENOUS

## 2020-10-27 ENCOUNTER — Encounter: Admit: 2020-10-27 | Discharge: 2020-10-27 | Payer: BC Managed Care – PPO

## 2020-10-27 DIAGNOSIS — Z8481 Family history of carrier of genetic disease: Secondary | ICD-10-CM

## 2020-10-27 DIAGNOSIS — Z8051 Family history of malignant neoplasm of kidney: Secondary | ICD-10-CM

## 2020-10-27 DIAGNOSIS — C569 Malignant neoplasm of unspecified ovary: Secondary | ICD-10-CM

## 2020-10-27 NOTE — Progress Notes
Kristina Velazquez   Genetic Counseling  Date: 10/27/2020  Kristina Velazquez  Freedom# [id number]  DOB 1990/08/15        Kristina Velazquez, age 30 y.o., was seen in [facility] today for a discussion of the results of the genetic testing that was performed during our previous visit.     Discussion: During our last visit we ordered the [facility] Panel: (23 genes: ATM, BARD1, BRCA1, BRCA2, BRIP1, CDH1, CHEK2, DICER1, EPCAM, MLH1, MSH2, MSH6, NBN, NF1, PALB2, PMS2, PTEN, RAD51C, RAD51D, RECQL, SMARCA4, STK11 and TP53). We added on the [facility] Renal Next Panel: 20 genes: (BAP1, EPCAM, CHEK2, FH, FLCN, MET, MITF, MLH1, MSH2, MSH6, PMS2, PTEN, SDHA, SDHB, SDHC, SDHD, TP53, TSC1, TSC2, and VHL). 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     Individual's who have positive mutations in the BRCA1 gene are at risk for breast and ovarian cancer but they may also have a slightly increased risk for female breast, pancreatic, and prostate cancers. Individuals with PMS2 related Lynch syndrome have an increased risk for developing colon, endometrial or uterine and other related cancers.     BRCA1 and Breast Cancer Risks:    Individuals who have a positive mutation in the BRCA1 gene are at increased risk for developing breast cancer within their lifetime. The general population risk is approximately 12% for female breast cancer, this increases to 46-87% lifetime risk with a BRCA1 mutation. There is also a risk for individuals with a BRCA1 mutation to have more than one primary breast cancer within their lifetime.     Given the high lifetime risks for developing breast cancer as well as the increased risk for developing a second primary breast cancer the [employer] (NCCN) recommend women consider a double mastectomy either prophylactically or once breast cancer has been diagnosed (see guidelines below). The NCCN guidelines version 1.2023 recommend women begin having breast awareness at 30 years of age, clinical breast exams every 6-12 months starting at 30 years of age, annual breast MRIs beginning at 30 years of age, annual mammograms beginning at 30 years of age (alternating between MRI and mammography every 6 months), and after 30 years of age management should be considered on an individual basis (see guidelines below).    Positive mutations in the BRCA1 gene are associated with a slight increased risk for female breast cancer. The general population risk for female breast cancer is 0.5%, this increases to 1-2% lifetime risk for individuals with a BRCA1 mutation. The NCCN guidelines version 1.2023 recommend men with BRCA1 or BRCA2 mutations have breast self-exam training and education at 30 years of age with a yearly clinical breast exam starting at age 56 (see guidelines below). As well, it is recommended to consider annual mammogram screening in men with excess breast tissue (gynecomastia) starting at 30 years of age or 10 years prior to the earliest known female breast cancer in the family.      --- Kristina Velazquez indicated she is currently focused on her ovarian cancer treatments, we discussed that when she is ready she can ask her providers to refer her to the Breast Cancer Prevention  [facility] at the [facility], this [facility] includes breast oncologists that are aware of the most up to date screening, surveillance and clinical trials for women diagnosed with a BRCA1 mutation.    BRCA1 and Ovarian Cancer Risks:    Individuals with a positive mutation in the BRCA1 gene are at increased risk for developing ovarian cancer. The general population risk for ovarian cancer is 1-2%, this increases to 24-54% risk with a BRCA1 mutation. The NCCN guidelines version 1.2021 recommend that women consider prophylactic bilateral salpingo oophorectomy between 57 and 74 years of age or once family planning is complete. If prophylactic oophorectomy is not elected transvaginal ultrasound starting at 29-59 years of age may be an option as well as serum CA-125 blood tests (see guidelines below). Limited data suggests that there may be a slightly increased risk of serous uterine cancer among women with a BRCA1 mutation pathogenic/likely pathogenic variant. The clinical significance of these findings is unclear. Further evaluation needs to be undertaken but a discussion regarding the risks and benefits of concurrent hysterectomy at the time of RRSO for these women should be considered.     --- These results will be shared with Kristina Velazquez's oncologists, they can discuss any potential implications for Kristina Velazquez's treatment.     BRCA1 and Other Cancer Risks:    Positive mutations in the BRCA1 gene are associated with slightly increased cancer risk for pancreas and prostate cancers. The general population risk for pancreatic cancer is 0.5-1%, this increases to 1-3 % lifetime risk. The NCCN guidelines currently recommend pancreas cancer screenings for individuals with a BRCA1 mutation who also have a close blood relative who has been diagnosed with pancreas cancer. Kristina Velazquez has no family history of pancreatic cancer, increased screening would not be recommended at this time.    The general population risk for prostate cancer is 15-18%, this increases to up to 16-20% lifetime risk (exact numbers are not well documented). The NCCN guidelines version 1.202 recommend that men consider having prostate cancer screening beginning at 30 years of age (see guidelines below).     --- No further screening recommendations based on the BRCA1 mutation for Kristina Velazquez at this time.     PMS2 Lynch Syndrome and Gastrointestinal Cancer Risks:    The general population risk for colon cancer is approximately 4.2%, this risk increases to 8.7-20% with PMS2 related Lynch syndrome with the average age of onset between 91 and 68 years of age. The NCCN guidelines recommend colonoscopy screenings beginning at 24-28 years of age for those with a known PMS2 gene mutation or 2-5 years before the earliest colon cancer if that is before 30 years of age. Colonoscopies are then repeated every 1-3 years (see NCCN guidelines below).      Individual's with PMS2 related Lynch syndrome may have a slightly increased risk for stomach, hepatobiliary tract, pancreas and small bowel cancer. These risks appear to be comparable to the general population risks for these cancers. The NCCN guidelines indicate there is no clear evidence to support screening for stomach and small bowel cancers in Lynch syndrome. Gastric cancer has other known risk factors including H. pylori infection, chronic autoimmune gastritis, female sex, and Asian ethnicity. Individuals can consider a baseline endoscopy at 85-38 years of age to determine if they have any of these above risk factors and can pursue screenings every 2-4 years if necessary.     --- Kristina Velazquez indicated she is currently focused on her ovarian cancer treatments, we discussed that when she  is ready she can ask her providers to refer her to the Gastrointestinal Cancer Prevention [facility] at the [facility]. This [facility] includes physicians who have expertise in patients who are at high risk for GI related cancers, they are aware of the most up to date screening, surveillance and clinical trials for people who have been diagnosed with Lynch Syndrome.     PMS2 Lynch Syndrome and Gynecological Cancer Risks:     The general population risk for developing uterine cancer is 3%, this increases to 13-26% with PMS2 related Lynch syndrome with the average age of diagnosis of 30 years of age. The NCCN guidelines recommend that women consider a prophylactic hysterectomy as a risk reducing option for those who have completed childbearing. Patients should be aware that dysfunctional bleeding warrants further evaluation. There is no clear evidence to support screening for endometrial cancer in Lynch syndrome, however, if a woman chooses not to pursue a prophylactic hysterectomy annual endometrial sampling is an option starting at 77-27 years of age and repeated every 1-2 years (see guidelines below).    The general population risk for ovarian cancer is 1%,  this increases to about 3% with PMS2 related Lynch syndrome. The NCCN guidelines indicate women consider prophylactic bilateral salpingo-oophorectomy as a risk reducing option. Data does not support routine ovarian screening for Lynch syndrome, transvaginal ultrasound for ovarian and uterine cancer has not been shown to be sufficiently sensitive but may be considered at the clinician's discretion. CA-125 is a blood test which may be used at the clinicians discretion (see guidelines below).      --- These results will be shared with Jasalyn's oncologists, they can discuss any potential implications for Myangel's treatment or surgery.     PMS2 Lynch Syndrome and Other Cancer Risks:     Other forms of Lynch syndrome have an increased risk for urinary tract, bladder, prostate, breast and brain cancers. PMS2 Lynch syndrome have no reported cases or reported increased risks for these cancer types beyond the general population risks. The NCCN guidelines indicate annual urinalysis screenings for urinary tract cancers (see guidelines below) and annual neurological exam for brain cancers (see guidelines below).       --- Based on her family history of a father with kidney and bladder cancer diagnoses Lareina could speak with her primary care physician about pursuing urinalysis screenings.     Additional Information:    Individuals can be born with both copies of the same Lynch gene having a pathogenic mutation (i.e. both copies of PMS2). They have a more severe condition called Constitutional Mismatch Repair Deficiency (CMMRD) syndrome which is associated with childhood cancers. If two parents have pathogenic mutations in the same Lynch syndrome gene, each pregnancy has a 1 in 4 (25%) chance of inheriting both pathogenic mutations leading to CMMRD syndrome.     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 Each of her siblings are at 1 in 2 (50%) chance of having inherited the familial pathogenic BRCA1 mutation. Each of her siblings are at 1 in 2 (50%) chance of having inherited the familial pathogenic PMS2 mutation.  If family members live in [place] or [place] and wish to pursue genetic testing they can call our offices to make an appointment at [phone] or they can find a Dentist using the [email or web address] website. Family members will need to bring a copy of the positive  genetic test result to their appointment in order to determine appropriate testing is being offered.    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 Individual's who have a known mutation in the ATM gene are at an increased risk for developing breast, ovarian and pancreas cancer. We do not recommend any changes to Neidy's medical management based on this gene change alone.    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 Individual's who have a known mutation in the MLH1 gene have Lynch syndrome and are at an increased risk for developing colon, uterine, ovarian, stomach, small bowel, pancreas, prostate and other cancer. We do not recommend any changes to Jilliann's medical management based on this gene change alone.      As more individuals are tested and more knowledge accumulates variants of uncertain significance eventually become reclassified as pathogenic or benign. Krislynn can call our offices for an update on the status of her variant of uncertain significance.    Time: 60 minutes. Discussed Kristina cancer syndromes, autosomal dominant inheritance / other inheritance as relevant, presymptomatic surveillance, management and treatment. Patient's test results and appropriate family members to pursue genetic testing.     The NCCN Guidelines Version 1.2023 indicate the following regarding management for individuals with BRCA1/2 mutations:    WOMEN  ? Breast awareness starting at age 19y.  ? Clinical breast exam, every 6-12 mo, starting at age 51 y.  ? Breast Screening  ? Age 26-29 y, annual breast MRI screening with contrast (preferred), or mammogram if MRI is unavailable or individualized based on family history if a breast cancer diagnosis before age 31 is present  ? Age 94-75 y, annual mammogram and breast MRI screening with contrast.  ? Age >56 y, management should be considered on an individual basis.  ? For women with a BRCA mutation who are treated for breast cancer, screening of remaining breast tissue with annual mammography and breast MRI should continue as described above.  ? Discuss option of risk-reducing mastectomy  ? Counseling should include a discussion regarding degree of protection, reconstruction options, and risks.  ? Recommend risk-reducing salpingo-oophorectomy (RRSO), typically between 28 and 83 y, and upon completion of child bearing. Because ovarian cancer onset in patients with BRCA2 mutations is an average of 8-10 years later than in patients with BRCA1 mutations, it is reasonable to delay RRSO until age 27-45 y in patients with BRCA2 mutations who have already maximized their breast cancer prevention (ie, undergone bilateral mastectomy).  ? Counseling includes a discussion of reproductive desires, extent of cancer risk, degree of protection for breast and ovarian cancer, management of menopausal symptoms, possible short-term hormone replacement therapy, and related medical issues.  ? Salpingectomy alone is not the standard of care for risk reduction although clinical trials are ongoing. The concern for risk-reducing salpingectomy alone is that women are still at risk for developing ovarian cancer. In addition, in premenopausal women, oophorectomy likely reduces the risk of developing breast cancer but the magnitude is uncertain and may be gene-specific.  ? Limited data suggest that there may be a slightly increased risk of serous uterine cancer among women with a BRCA1 pathogenic/likely pathogenic variant. The clinical significance of these findings is unclear. Further evaluation of the risk of serous uterine cancer in the BRCA population needs to be undertaken. The provider and patient should discuss the risks and benefits of concurrent hysterectomy at the time of RRSO for women  with a  BRCA1 pathogenic/likely pathogenic variant prior to surgery.  ? Address psychosocial, social, and quality of life aspects of undergoing risk-reducing mastectomy and/or salpingo oophorectomy.  ? For those patients who have not elected risk RRSO, transvaginal ultrasound for ovarian cancer has not been shown to be sufficiently sensitive or specific as to support a positive recommendation, but may be considered at the clinician's discretion starting at age 19-35 y. Serum CA-125 is an additional ovarian screening test with caveats similar to transvaginal ultrasound.  ? Consider risk reduction agents as options for breast and ovarian cancer, including discussing risks and benefits  ? Consider investigational imaging and screening studies, when available (eg, novel imaging technologies, more frequent screening intervals) in the context of a clinical trial.    MEN  ? Breast self-exam training and education starting at age 69 y  ? Clinical breast exam, every 12 mo, starting at age 54 y  ? Consider annual mammogram screening in men with gynecomastia starting at age 79 or 10 years before the earliest known female breast cancer in there family (whichever comes first)   ? Starting at age 57 y:  ? Recommend prostate cancer screening for BRCA2 carriers  ? Consider prostate cancer screening for BRCA1 carriers    MEN AND WOMEN  ? Education regarding signs and symptoms of cancer(s), especially those associated with BRCA gene mutations.  ? No specific screening guidelines exist for melanoma, but screening may be individualized based on cancers observed in the family based on cancers observed in the family.  ? Pancreatic cancer screening should be based on family history.      PANCREATIC CANCER   For individuals with pathogenic/likely pathogenic germline variants in one of the following pancreatic cancer susceptibility genes (ATM, BRCA1, BRCA2, MLH1, MSH2, MSH6, EPCAM, PALB2, TP53) AND 1 or more first or second-degree relatives with a history of pancreas cancer, consider pancreatic cancer screening starting at 30 years of age or 10 years prior to the youngest diagnosis in the family. This should be done in experienced high-volume centers, ideally under research conditions, with discussion of limitations, cost, and incidence of non-malignant pancreatic abnormalities, as well as uncertainties about the potential benefits of pancreatic cancer screening. Pancreatic screening can include annual contrast-enhanced MRI/MRCP and/or EUS with consideration of shorter screening intervals for individuals found to have worrisome screening.     RISK TO RELATIVES  ? Advise about possible inherited cancer risk to relatives, options of risk assessment and management  ? Recommend genetic counseling and consideration of testing for at-risk relatives     The NCCN Guidelines Version 1.2022 indicate the following regarding management for individuals with Lynch Syndrome ( PMS2 mutation carriers):    -Colon Cancer:   ? Colonoscopy at age 73-35 y or 43-5 y prior to the earliest colon cancer if it is diagnosed before age 24 y and repeat every 1-3 y.    ? The panel recommends that all individuals with LS who have a risk for future colorectal cancer (ie, excluding those with prior total proctocolectomy) consider using daily aspirin to reduce their future risk of colorectal cancer. The decision to use aspirin for reduction of colorectal cancer risk in LS and the dose chosen should be made on an individual basis including a discussion of  individual risks, dose, benefits, adverse effects and childbearing plans. In determining whether an individual with LS should take aspirin and in deciding on the appropriate dosing, the panel recommends that providers carefully review patient- specific factors that may  increase the risk of aspirin therapy - including but not limited to increased age, prior allergy, concurrent use of antiplatelets/anticoagulants, and untreated H. pylori or unconfirmed H. pylori eradication - as well as patient- specific factors that indicate a comparably low future cumulative risk of colorectal cancer (ie, increased age, PMS2 Lynch syndrome, history of prior colectomy) and who may thus be less likely to experience significant benefit.    -Other Extracolonic Cancers:  ? Endometrial Cancer:  ? PMS2 carriers appear to be at only a modestly increased risk of endometrial cancer in contrast to MLH1, MSH2 and MSH6.  ? Because endometrial cancer can often be detected early based on symptoms, women should be educated regarding the importance of prompt reporting and evaluation of any abnormal uterine bleeding or postmenopausal bleeding. The evaluation of these symptoms should include endometrial biopsy.  ? Total hysterectomy has not been shown to reduce endometrial cancer mortality, but can reduce the incidence of endometrial cancer. Therefore, hysterectomy is a risk-reducing option that can be considered.  ? Timing of total hysterectomy can be individualized based on whether childbearing is complete, comorbidities, family history, and LS gene mutation, as risks for endometrial cancer vary by pathogenic variant.  ? Endometrial cancer screening does not have proven benefit in women with LS. However, endometrial biopsy is both highly sensitive and highly specific as a diagnostic procedure. Screening via endometrial biopsy every 1 to 2 years starting at age 4-35 can be considered.  ? Transvaginal ultrasound to screen for endometrial cancer in postmenopausal women has not been shown to be sufficiently sensitive or specific as to support a positive recommendation, but may be considered at the clinician's discretion. Transvaginal ultrasound is not recommended as a screening tool in premenopausal women due to the wide range of endometrial stripe thickness throughout the normal menstrual cycle.    ? Ovarian Cancer  ? Insufficient evidence exists to make a specific recommendation for risk- reducing salpingo-oophorectomy (RRSO) for PMS2 pathogenic variant carriers. PMS2 pathogenic variant carriers appear to be at no greater than average risk for ovarian cancer, and may consider deferring surveillance and may reasonably elect not to have oophorectomy.  ? Bilateral salpingo-oophorectomy (BSO) may reduce the incidence of ovarian cancer. The decision to have a BSO as a risk-reducing option by women who have completed childbearing should be individualized. Timing of BSO should be individualized based on whether childbearing is complete, menopause status, comorbidities, family history, and LS gene pathogenic variant, as risks for ovarian cancer vary by mutated gene.   ? Since there is no effective screening for ovarian cancer, women should be educated on the symptoms that might be associated with the development of ovarian cancer, such as pelvic or abdominal pain, bloating, increased abdominal girth, difficulty eating, early satiety, or urinary frequency or urgency. Symptoms that persist for several weeks and are a change from a woman's baseline should prompt her to seek evaluation by her physician.  ? Data do not support routine ovarian screening for LS. Transvaginal ultrasound for ovarian cancer screening has not been shown to be sufficiently sensitive or specific as to support a routine recommendation, but may be considered at the clinician's discretion. Serum CA-125 is an additional ovarian screening test with caveats similar to transvaginal ultrasound.  ? consider risk reduction agents for endometrial and ovarian cancers, including discussing risks and benefits.    ? Urothelial Cancer:   ? There is no clear evidence to support surveillance for urothelial cancers in LS. Surveillance may be considered in selected individuals such  as with a family history of urothelial cancer. Surveillance options may include annual urinalysis starting at 30-35 y. However, there is insufficient evidence to recommend a particular surveillance strategy.    ? Gastric and Small Bowel Cancer:   ? Consider upper GI surveillance with EGD starting at age 20-40 years and repeat every 2-4 years, preferably performed in conjunction with colonoscopy (Ladigan-Badura S, et al. Int J Cancer 2021;148:106-114; Theressa Millard, et al. Gastrointest Endosc 2022;95:105-114; Remus Blake, et al. Can Prev Res [[place]] 2020;13:1047-1054). Age of initiation prior to age 20 and/or surveillance interval less than 2 years may be considered based on family history of upper GI cancers or high-risk endoscopic findings (such as incomplete or extensive GIM, gastric or duodenal adenomas, or Barrett esophagus with dysplasia). Random biopsy of the proximal and distal stomach should at minimum be performed on the initial procedure to assess for H. pylori (with treatment indicated if H. pylori is detected), autoimmune gastritis, and intestinal metaplasia. Push enteroscopy can be considered in place of EGD to enhance small bowel visualization, although the yield of push enteroscopy over EGD in LS remains uncertain. There are limited available data on upper GI cancer risk in PMS2 LS, and new evidence is likely to inform changes to these recommendations in the future.  ? Individuals not undergoing upper endoscopic surveillance should have one-time noninvasive testing for H. pylori at the time of LS diagnosis, with treatment indicated if H. pylori is detected. The value of eradication for the prevention of gastric cancer in LS is unknown.    ? Pancreatic Cancer:   ? PMS2 carriers have not been shown to be at increased risk for pancreatic tumors.  ? patients with a family history of pancreatic cancer should be managed based on careful assessment and clinical judgement.  ? see NCCN guidelines for genetic/familial high risk assessment: Breast, Ovarian and Pancreatic for additional details on pancreatic cancer screening.    ? Prostate Cancer:  ? See NCCN guidelines for prostate cancer. Men with LS should consider their risk based on the LS gene and family history of prostate cancer. The NCCN guidelines for Prostate Cancer Early Detection recommend that it is reasonable for men with LS to consider beginning shared decision making about prostate cancer screening at age 61 years and to consider screening at annual intervals rather than every other year.    ? Breast Cancer:   ? There have been suggestions that there is an increased risk for breast cancer in LS patients; however, there is not enough evidence to support increased screening above average-risk breast cancer screening recommendations or those based on personal/family history of breast cancer.    ? Brain Cancer:   ? patients should be educated regarding signs and symptoms of neurologic cancer and the importance of prompt reporting of abnormal symptoms to their physicians.     ? Skin manifestations  ? frequency of malignant and benign skin tumors such as sebaceous adenocarcinomas, sebaceous adenomas and keratoacanthomas has been reported to be increased among patients with LS, but cumulative lifetime risk and median age of presentation are uncertain.  ? consider skin exam every 1-2 years with a health care provider skilled in identifying LS associated skin manifestations. Age to start surveillance is uncertain and can be individualized.            Salvatore Marvel, MS [facility]  Certified Genetic Counselor  [facility]  [address]  [address] c/o Genetics team  [place], [place] [address]  Email: [email or web address]  Telephone [phone]  Fax: [phone]  Appointments: [phone]

## 2020-10-28 ENCOUNTER — Encounter: Admit: 2020-10-28 | Discharge: 2020-10-28 | Payer: BC Managed Care – PPO

## 2020-10-28 DIAGNOSIS — C569 Malignant neoplasm of unspecified ovary: Secondary | ICD-10-CM

## 2020-10-28 DIAGNOSIS — R000 Tachycardia, unspecified: Secondary | ICD-10-CM

## 2020-10-30 ENCOUNTER — Encounter: Admit: 2020-10-30 | Discharge: 2020-10-30 | Payer: BC Managed Care – PPO

## 2020-10-30 ENCOUNTER — Ambulatory Visit: Admit: 2020-10-30 | Discharge: 2020-10-30 | Payer: BC Managed Care – PPO

## 2020-10-30 DIAGNOSIS — C569 Malignant neoplasm of unspecified ovary: Secondary | ICD-10-CM

## 2020-10-30 DIAGNOSIS — R000 Tachycardia, unspecified: Secondary | ICD-10-CM

## 2020-10-30 DIAGNOSIS — C786 Secondary malignant neoplasm of retroperitoneum and peritoneum: Secondary | ICD-10-CM

## 2020-10-30 DIAGNOSIS — U071 COVID: Secondary | ICD-10-CM

## 2020-10-30 MED ORDER — CIPROFLOXACIN HCL 500 MG PO TAB
ORAL_TABLET | ORAL | 0 refills | 10.00000 days | Status: DC
Start: 2020-10-30 — End: 2020-10-30

## 2020-10-30 MED ORDER — METRONIDAZOLE 500 MG PO TAB
ORAL_TABLET | 0 refills | Status: AC
Start: 2020-10-30 — End: ?
  Filled 2020-11-02: qty 6, 1d supply, fill #1

## 2020-10-30 MED ORDER — PRESURGERY KIT A
PACK | 0 refills | Status: DC
Start: 2020-10-30 — End: 2020-10-30

## 2020-10-30 MED ORDER — PRESURGERY KIT A
PACK | 0 refills | Status: AC
Start: 2020-10-30 — End: ?
  Filled 2020-11-02: qty 1, 1d supply, fill #1

## 2020-10-30 MED ORDER — CIPROFLOXACIN HCL 500 MG PO TAB
ORAL_TABLET | ORAL | 0 refills | 10.00000 days | Status: AC
Start: 2020-10-30 — End: ?
  Filled 2020-11-02: qty 1, 1d supply, fill #1

## 2020-10-30 MED ORDER — METRONIDAZOLE 500 MG PO TAB
ORAL_TABLET | 0 refills | Status: DC
Start: 2020-10-30 — End: 2020-10-30

## 2020-10-30 NOTE — Anesthesia Pre-Procedure Evaluation
Anesthesia Pre-Procedure Evaluation    Name: Kristina Velazquez      MRN: [id number]     DOB: [birth year]     Age: 30 y.o.     Sex: female   _________________________________________________________________________     Procedure Info:   Procedure Information     Date/Time: 11/14/20 0800    Procedures:       TOTAL ABDOMINAL HYSTERECTOMY AND BILATERAL SALPINGO-OOPHORECTOMY WITH OMENTECTOMY AND RADICAL RESECTION FOR DEBULKING (Bilateral ) - TOTAL CASE LENGTH 7 HRS, COMBO CASE WITH DR AL-KASSPOOLES      EXPLORATORY LAPAROTOMY WITH/ WITHOUT BIOPSY; CYTOREDUCTIVE SURGERY, HYPERTHERMIC INTRAPERITONEAL CHEMOTHERAPY, POSSIBLE BOWEL RESECTION, POSSIBLE OSTOMY, POSSIBLE FEEDING TUBE, ALL OTHER INDICATED PROCEDURES (N/A )      EXCISION/ DESTRUCTION INTRA-ABDOMINAL TUMOR/ CYST/ ENDOMETRIOMAS/ 1 OR MORE PERITONEAL/ MESENTERIC/ RETROPERITONEAL - PRIMARY/ SECONDARY - LARGEST TUMOR 5 CM OR LESS (N/A )      EXCISION/ DESTRUCTION INTRA-ABDOMINAL TUMORS/ CYSTS/ ENDOMETRIOMAS/ 1 OR MORE PERITONEAL/ MESENTERIC/ RETROPERITONEAL PRIMARY/ SECONDARY TUMORS - LARGEST TUMOR 5.1 TO 10.0 CM DIAMETER (N/A )      EXCISION/ DESTRUCTION INTRA-ABDOMINAL TUMORS/ CYSTS/ ENDOMETRIOMAS/ 1 OR MORE PERITONEAL/ MESENTERIC/ RETROPERITONEAL PRIMARY/ SECONDARY TUMORS - LARGEST TUMOR GREATER THAN 10.0 CM (N/A )    Location: MAIN OR 31 / Main OR/Periop    Surgeons: Eileen Stanford, MD; Dia Crawford, MD          Physical Assessment  Vital Signs (last filed in past 24 hours):  BP: 132/86 (10/03 1253)  Temp: 36.3 ?C (97.4 ?F) (10/03 1253)  Pulse: 111 (10/03 1253)  Respirations: 16 PER MINUTE (10/03 1253)  SpO2: 100 % (10/03 1253)  O2 Device: None (Room air) (10/03 1253)  Height: 170.2 cm (5' 7) (10/03 1253)  Weight: 95.4 kg (210 lb 6.4 oz) (10/03 1253)      Patient History   Allergies   Allergen Reactions   ? Paclitaxel CHEST TIGHTNESS, VISION CHANGES and REDNESS   ? Peanut RASH   ? Carboplatin FLUSHING (SKIN), HEADACHE and REDNESS   ? Hydrocodone STOMACH UPSET        Current Medications    Medication Directions   ALPRAZolam (XANAX) 1 mg tablet Take 1 mg by mouth daily as needed for Anxiety.   amphetamine-dextroamphetamine XR (ADDERALL XR) 30 mg capsule Take 30 mg by mouth twice daily.   dexAMETHasone (DECADRON) 4 mg tablet Take two tablets by mouth daily. On Days 2-4 of each cycle.   ibuprofen (ADVIL) 200 mg tablet Take 200-400 mg by mouth every 8 hours as needed for Pain. Take with food.   lidocaine/prilocaine (EMLA) 2.5/2.5 % topical cream Apply  topically to affected area as Needed. Apply 30-60 minutes prior to port access.   MULTIVITAMIN PO Take 1 tablet by mouth daily.   naloxone (NARCAN) 4 mg/actuation nasal spray Insert 1 spray into 1 nostril as needed for signs of opioid overdose then call 911. May repeat dose every 2-3 minutes (alternate nostrils) until medical team arrives.  Indications: opioid overdose   ondansetron HCL (ZOFRAN) 8 mg tablet Take one tablet by mouth every 8 hours as needed (nausea and vomiting).   oxyCODONE (ROXICODONE) 5 mg tablet Take one tablet by mouth every 6 hours as needed for Pain.   pantoprazole DR (PROTONIX) 20 mg tablet Take 20 mg by mouth daily.   prochlorperazine maleate (COMPAZINE) 10 mg tablet Take one tablet by mouth every 6 hours as needed for Nausea or Vomiting.   traMADoL (ULTRAM) 50 mg tablet Take 50 mg by  mouth every 8 hours as needed for Pain.         Review of Systems/Medical History      Patient summary reviewed  Nursing notes reviewed  Pertinent labs reviewed    PONV Screening: Female gender and Non-smoker  No history of anesthetic complications  No family history of anesthetic complications        Pulmonary       Not a current smoker        No indications/hx of asthma    no COPD      No sleep apnea      Cardiovascular         Exercise tolerance: >4 METS      No hypertension,       No past MI,       No palpitations      No angina      No hyperlipidemia      8 METs per DASI      GI/Hepatic/Renal GERD, well controlled      No hx of liver disease     No renal disease      Neuro/Psych       No seizures      No hx neuromuscular disease      No CVA        Psychiatric history          ADHD          Anxiety        Endocrine/Other       No diabetes      No hypothyroidism      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    Abdominal Findings:         Abdominal exam deferred    Neurological Findings:       Alert and oriented x 3    Constitutional findings:       No acute distress      Well-developed      Well-nourished       Diagnostic Tests  Hematology:   Lab Results   Component Value Date    HGB 14.3 10/16/2020    HCT 42.7 10/16/2020    PLTCT 248 10/16/2020    WBC 8.6 10/16/2020    NEUT 65 10/16/2020    ANC 5.70 10/16/2020    ALC 2.00 10/16/2020    MONA 9 10/16/2020    AMC 0.80 10/16/2020    EOSA 1 10/16/2020    ABC 0.00 10/16/2020    MCV 84.7 10/16/2020    MCH 28.4 10/16/2020    MCHC 33.5 10/16/2020    MPV 6.5 10/16/2020    RDW 15.5 10/16/2020         General Chemistry:   Lab Results   Component Value Date    NA 140 10/16/2020    K 3.8 10/16/2020  CL 102 10/16/2020    CO2 26 10/16/2020    GAP 12 10/16/2020    BUN 11 10/16/2020    CR 0.85 10/16/2020    GLU 106 10/16/2020    CA 9.4 10/16/2020    ALBUMIN 4.5 10/16/2020    TOTBILI 0.4 10/16/2020      Coagulation:   Lab Results   Component Value Date    PT 12.5 08/21/2020    PTT 31.3 08/21/2020    INR 1.1 08/21/2020     CT Chest 10/25/20  1. No pulmonary metastatic disease.   2. Decrease in size of a mildly enlarged pericardial lymph node suggestive interval response to therapy by a nodal metastasis.     CT Abd/Pelvis 10/25/20  Decrease in size of the bilateral adnexal masses, peritoneal metastases, and malignant ascites suggesting favorable response to therapy by the patient's metastatic ovarian cancer.     Anesthesia Plan    ASA score: 2   Plan: general  Induction method: intravenous  NPO status: acceptable      Informed Consent  Anesthetic plan and risks discussed with patient.  Use of blood products discussed with patient  Blood Consent: consented      Plan discussed with: anesthesiologist and resident.

## 2020-10-31 ENCOUNTER — Encounter: Admit: 2020-10-31 | Discharge: 2020-10-31 | Payer: BC Managed Care – PPO

## 2020-10-31 NOTE — Telephone Encounter
Called patient back to let her know we messaged GI team regarding earliest to be scheduled for EGD/colonoscopy would be November. Let patient know that we would keep her updated. She was agreeable.

## 2020-11-01 ENCOUNTER — Encounter: Admit: 2020-11-01 | Discharge: 2020-11-01 | Payer: BC Managed Care – PPO

## 2020-11-01 ENCOUNTER — Ambulatory Visit: Admit: 2020-11-01 | Discharge: 2020-11-01 | Payer: BC Managed Care – PPO

## 2020-11-01 DIAGNOSIS — C786 Secondary malignant neoplasm of retroperitoneum and peritoneum: Secondary | ICD-10-CM

## 2020-11-01 DIAGNOSIS — C569 Malignant neoplasm of unspecified ovary: Secondary | ICD-10-CM

## 2020-11-01 MED ORDER — PEG-ELECTROLYTE SOLN 420 GRAM PO SOLR
0 refills | Status: AC
Start: 2020-11-01 — End: ?

## 2020-11-01 NOTE — Progress Notes
Confirmed procedure date/time with patient, reviewed prep instructions with patient, verbalized good understanding, script sent to Pharmacy, confirmed patient would have a driver and placed instructions in MyChart for patient.    -----------    EGD (ESOPHAGOGASTRODUODENOSCOPY) PREP      Upper GI endoscopy allows healthcare providers to look directly into the beginning of your gastrointestinal(GI) tract.  The esophagus, stomach, and duodenum (first part of the small intestine) make up the upper GI tract.      5 Days Prior:  1. Check with your prescribing physician for instructions about stopping your blood thinner.  Examples of blood thinners are Aleve, Aspirin. Coumadin, Eliquis, Ibuprofen, Naproxen, Plavix, and Xarelto.  2. Do not give yourself a Lovenox injection the morning of the test. Lovenox injections may be taken as usual through the day before your test.    Day of Exam:  1. Do not eat or drink anything after midnight the night before your exam. However, if your exam is in the afternoon you may drink clear liquids only up until (4) hours before your scheduled procedure time.  After this, you should have nothing by mouth.  This includes GUM or CANDY.   a. Chewing tobacco must be stopped  (6) hours before your scheduled procedure.   b. If you have an early morning test, take ONLY your essential morning medications (heart, blood pressure, seizure, etc.) with a small sip of water.   c. You will be sedated for the procedure. A responsible adult must drive you home (no Benedetto Goad, taxis, or buses are permitted). If you do not have a driver we will be unable to do the test.   d. You will be here for (3-4) hours from arrival time.   e. You will not be able to return to work the same day.   f. Please bring a list of your current medications and the dosages with you.     The Procedure:  ? You will lie on the endoscopy table. Usually patients lie on the left side.  ? You will be monitored and given oxygen.   ? You are given sedation (relaxing) medication through an intravenous (IV) line.  ? The healthcare provider will put the endoscope in your mouth and down your esophagus. It is thinner than most pieces of food that you swallow.  It will not affect your breathing. The medicine helps keep you from gagging.   ? Air is inserted to expand your GI tract. It can make you burp.  ? During the procedure, the healthcare provider can take biopsies (tissue samples), remove abnormalities such as polyps, or treat abnormalities though a variety of devices placed through the endoscope. You will not feel this.   ? The endoscope carries images of your upper GI tract to a video screen.  ? An adult must drive you home.   SPLIT DOSE NULYTELY/GOLYTELY PREP (Recommended)  PATIENT PREPARATION INSTRUCTIONS- COLONOSCOPY      Instructions: Fill your prescription at least (3) days before your scheduled procedure time.                 1 Week Prior:   1. Stop taking iron supplements (including multivitamins containing iron).  2. Do not eat any foods containing nuts, seeds, or kernels (for example: sunflower seeds or popcorn).  3. Discuss diabetic medications and insulin with the prescribing physician.    5 Days Prior:  1. Check with your prescribing physician for instructions about stopping your blood thinner. Examples of blood  thinners are Aleve, Aspirin, Coumadin, Eliquis, Ibuprofen, Naproxen, Plavix, and Xarelto.  2. Do not give yourself a Lovenox injection the morning of the test. Lovenox injections may be taken as usual through the day before the test.    4 Days Prior:  3. Stop taking Metamucil, Perdiem, Citrucel, or any other bulk laxatives. Miralax is okay.     Day Prior:   2. Beginning in the morning, start a clear liquid diet. Drink a generous amount of clear liquids throughout the day, no alcohol. If you are on a fluid restriction, drink the recommended amount of clear liquids allowed by your physician. No solid or creamed/pureed foods.        Clear Liquid Diet (avoid all items with RED, PURPLE, or ORANGE coloring)  Water    Apple or White Grape Juice   Coffee or tea without cream   Tea    White Cranberry Juice   Chicken Bouillon or Broth (no noodles)  Soda Pop(all pop Is OK)  Green or Yellow Popsicles  Beef Bouillon or Broth (no noodles)    3. At 11:00 A.M. fill the Nulytely/Golytely jug to the fill? line with lukewarm drinking water. Cap the jug and shake to dissolve the powder.  Place the jug in the refrigerator.   4. At 5:00 P.M. drink one 8-ounce glass of Nulytely/Golytely and repeat every (10-15) minutes until you have finished the first (?) gallon of Nulytely/Golytely (3) liters. If you become nauseated hold off on drinking for (30) minutes or so, then resume.    Day of Test:  1. Continue clear liquid diet.  2. Five hours before your scheduled procedure time drink the remaining Nulytely/Golytely 8-ounces at a time. Repeat every (10-15) minutes until you have finished.   3. You may drink clear liquids up until (4) hours before your scheduled procedure time. After this you should have nothing by mouth. This includes GUM or CANDY.   a. Chewing tobacco must be stopped (6) hours before your scheduled procedure.   b. If you have an early morning test, take ONLY your essential morning medications (heart, blood pressure, seizure, etc.) with a small sip of water.  4. You will be sedated for the procedure.  A responsible adult must drive you home (no Benedetto Goad, taxis or buses are allowed). If you do not have a driver we will be unable to do the test.  5. You will be here for (3-4) hours from arrival time.   6. You will not be able to return to work the same day if you have received sedation.   7. Please bring a list of your current medications and the dosages with you.

## 2020-11-02 ENCOUNTER — Encounter: Admit: 2020-11-02 | Discharge: 2020-11-02 | Payer: BC Managed Care – PPO

## 2020-11-03 ENCOUNTER — Encounter: Admit: 2020-11-03 | Discharge: 2020-11-03 | Payer: BC Managed Care – PPO

## 2020-11-06 ENCOUNTER — Encounter: Admit: 2020-11-06 | Discharge: 2020-11-06 | Payer: BC Managed Care – PPO

## 2020-11-06 ENCOUNTER — Ambulatory Visit: Admit: 2020-11-06 | Discharge: 2020-11-06 | Payer: BC Managed Care – PPO

## 2020-11-06 DIAGNOSIS — C569 Malignant neoplasm of unspecified ovary: Secondary | ICD-10-CM

## 2020-11-06 DIAGNOSIS — C786 Secondary malignant neoplasm of retroperitoneum and peritoneum: Secondary | ICD-10-CM

## 2020-11-06 MED ORDER — SODIUM,POTASSIUM,MAG SULFATES 17.5-3.13-1.6 GRAM PO SOLR
354 mL | ORAL | 0 refills | 30.00000 days | Status: AC
Start: 2020-11-06 — End: ?

## 2020-11-06 NOTE — Progress Notes
Confirmed procedure date/time with patient, reviewed prep instructions with patient, verbalized good understanding, script sent to [facility], [address], [place], [place]., confirmed patient would have a driver and placed instructions in MyChart for patient.    -------------------    EGD (ESOPHAGOGASTRODUODENOSCOPY) PREP      Upper GI endoscopy allows healthcare providers to look directly into the beginning of your gastrointestinal(GI) tract.  The esophagus, stomach, and duodenum (first part of the small intestine) make up the upper GI tract.      5 Days Prior:  1. Check with your prescribing physician for instructions about stopping your blood thinner.  Examples of blood thinners are Aleve, Aspirin. Coumadin, Eliquis, Ibuprofen, Naproxen, Plavix, and Xarelto.  2. Do not give yourself a Lovenox injection the morning of the test. Lovenox injections may be taken as usual through the day before your test.    Day of Exam:  1. Do not eat or drink anything after midnight the night before your exam. However, if your exam is in the afternoon you may drink clear liquids only up until (4) hours before your scheduled procedure time.  After this, you should have nothing by mouth.  This includes GUM or CANDY.   a. Chewing tobacco must be stopped  (6) hours before your scheduled procedure.   b. If you have an early morning test, take ONLY your essential morning medications (heart, blood pressure, seizure, etc.) with a small sip of water.   c. You will be sedated for the procedure. A responsible adult must drive you home (no Benedetto Goad, taxis, or buses are permitted). If you do not have a driver we will be unable to do the test.   d. You will be here for (3-4) hours from arrival time.   e. You will not be able to return to work the same day.   f. Please bring a list of your current medications and the dosages with you.     The Procedure:  ? You will lie on the endoscopy table. Usually patients lie on the left side.  ? You will be monitored and given oxygen.   ? You are given sedation (relaxing) medication through an intravenous (IV) line.  ? The healthcare provider will put the endoscope in your mouth and down your esophagus. It is thinner than most pieces of food that you swallow.  It will not affect your breathing. The medicine helps keep you from gagging.   ? Air is inserted to expand your GI tract. It can make you burp.  ? During the procedure, the healthcare provider can take biopsies (tissue samples), remove abnormalities such as polyps, or treat abnormalities though a variety of devices placed through the endoscope. You will not feel this.   ? The endoscope carries images of your upper GI tract to a video screen.  ? An adult must drive you home.     ---------------------------------------------------------    TWO DAY SPLIT DOSE SUPREP- COLONOSCOPY  DO NOT TAKE IF YOU HAVE RENAL (KIDNEY) ISSUES      Instructions: Fill your prescription at least (3) days before your scheduled procedure time. Purchase over the counter Dulcolax tablets.     1 Week Prior:   1. Stop taking iron supplements (including multivitamins containing iron).  2. Do not eat any foods containing nuts, seeds, or kernels (for example: sunflower seeds or popcorn).  3. Discuss diabetic medications and insulin with the prescribing physician.     5 Days Prior:  1. Stop ALL  blood thinners such as Coumadin, Aspirin, Ibuprofen, Plavix, Aleve, and Naproxen. Check with your prescribing physician for instructions about stopping your blood thinner.  2. Lovenox injections may be taken as usual through the day before your test. Do not give yourself a Lovenox injection the morning of the test.    4 Days Prior:  3. Stop taking Metamucil, Perdiem, Citrucel, or any other bulk laxatives. Miralax is okay.    2 Days Prior:   2. Beginning in the morning, start a clear liquid diet. Drink a generous amount of clear liquids throughout the day. No solid or creamed/pureed foods. No alcohol    Clear Liquid Diet (avoid all items with RED, PURPLE, or ORANGE coloring)  Water    Apple or White Grape Juice   Coffee or tea without cream   Tea    White Cranberry Juice   Chicken Bouillon or Broth (no noodles)  Soda Pop(all pop Is OK)  Green or Yellow Popsicles  Beef Bouillon or Broth (no noodles)    3. At 5:00 P.M. take (4) Dulcolax tablets.    Day Prior:  1. Continue a clear liquid diet.  2. At 5:00 P.M. mix one 6-ounce bottle of Suprep liquid into the mixing container. Add drinking water to the   16-ounce line on the container and mix.  Drink all the liquid in the container. You MUST drink (2) more 16-ounce containers of WATER over the next hour. Continue with additional clear liquids as you wish.    Day of Test:  1. Continue clear liquid diet.  2. Five hours before your scheduled procedure time mix  (1) 6- ounce bottle of Suprep liquid into the mixing container. Add drinking water to the 16-ounce line on the container and mix.  Drink all the liquid in the container. You MUST drink (2) more 16-ounce containers of WATER over the next hour.  3. You may drink clear liquids up until (4) hours before your scheduled procedure time. After this you should have nothing by mouth. This includes GUM or CANDY.   a. Chewing tobacco must be stopped (6) hours before your scheduled procedure.   b. If you have an early morning test, take ONLY your essential morning medications (heart, blood pressure, seizure, etc.) with a small sip of water.   4. You will be sedated for the procedure.  A responsible adult must drive you home (no Benedetto Goad, taxis or buses are allowed). If you do not have a driver we will be unable to do the test.   5. You will be here for (3-4) hours from arrival time.   6. You will not be able to return to work the same day if you have received sedation.   7. Please bring a list of your current medications and the dosages with you.

## 2020-11-09 ENCOUNTER — Encounter: Admit: 2020-11-09 | Discharge: 2020-11-09 | Payer: BC Managed Care – PPO

## 2020-11-10 ENCOUNTER — Encounter: Admit: 2020-11-10 | Discharge: 2020-11-10 | Payer: BC Managed Care – PPO

## 2020-11-10 NOTE — Anesthesia Pre-Procedure Evaluation
Anesthesia Pre-Procedure Evaluation    Name: Kristina Velazquez      MRN: [id number]     DOB: 1990-08-07     Age: 30 y.o.     Sex: female   _________________________________________________________________________     Procedure Info:   Procedure Information     Date/Time: 11/13/20 1430    Procedures:       ESOPHAGOGASTRODUODENOSCOPY WITH SPECIMEN COLLECTION BY BRUSHING/ WASHING (N/A )      COLONOSCOPY DIAGNOSTIC WITH SPECIMEN COLLECTION BY BRUSHING/ WASHING - FLEXIBLE (N/A )    Location: [facility] / ENDO/GI    Surgeons: Comer Locket, MD          Physical Assessment  Vital Signs (last filed in past 24 hours):         Patient History   Allergies   Allergen Reactions   ? Hydrocodone STOMACH UPSET and VOMITING   ? Paclitaxel CHEST TIGHTNESS, VISION CHANGES and REDNESS   ? Peanut RASH   ? Carboplatin FLUSHING (SKIN), HEADACHE and REDNESS        Current Medications    Medication Directions   ALPRAZolam (XANAX) 1 mg tablet Take 1 mg by mouth daily as needed for Anxiety.   amphetamine-dextroamphetamine XR (ADDERALL XR) 30 mg capsule Take 30 mg by mouth twice daily.   ciprofloxacin (CIPRO) 500 mg tablet Please take 500mg  at 11PM the night before surgery.  Indications: ERAS pre-surgical preparation   dexAMETHasone (DECADRON) 4 mg tablet Take two tablets by mouth daily. On Days 2-4 of each cycle.   diphenhydrAMINE hcl (BENADRYL ALLERGY) 25 mg tablet Take 25-50 mg by mouth at bedtime as needed.   ibuprofen (ADVIL) 200 mg tablet Take 200-400 mg by mouth every 8 hours as needed for Pain. Take with food.   lidocaine/prilocaine (EMLA) 2.5/2.5 % topical cream Apply  topically to affected area as Needed. Apply 30-60 minutes prior to port access.   metroNIDAZOLE (FLAGYL) 500 mg tablet Please take at the following times: 1pm: Take 2 tablets of Flagyl (500mg  tablets) 3pm: Take 2 tablets of Flagyl (500mg  tablets) 11pm: Take 2 tablets of Flagyl (500mg  tablets) *Please try to take all the antibiotics.  Indications: ERAS pre-surgical preparation   MULTIVITAMIN PO Take 1 tablet by mouth daily.   naloxone (NARCAN) 4 mg/actuation nasal spray Insert 1 spray into 1 nostril as needed for signs of opioid overdose then call 911. May repeat dose every 2-3 minutes (alternate nostrils) until medical team arrives.  Indications: opioid overdose   ondansetron HCL (ZOFRAN) 8 mg tablet Take one tablet by mouth every 8 hours as needed (nausea and vomiting).   oxyCODONE (ROXICODONE) 5 mg tablet Take one tablet by mouth every 6 hours as needed for Pain.   pantoprazole DR (PROTONIX) 20 mg tablet Take 40 mg by mouth at bedtime daily.   peg-electrolyte solution (NULYTELY LEMON-LIME) 420 gram oral solution Mix as directed on package. Drink (8oz) every 10 minutes until gone. Refrigerate once mixed.  Indications: emptying of the bowel   prochlorperazine maleate (COMPAZINE) 10 mg tablet Take one tablet by mouth every 6 hours as needed for Nausea or Vomiting.   sodium,potassium,mag sulfates (SUPREP BOWEL PREP KIT) 17.5-3.13-1.6 gram oral solution Take 354 mL by mouth as directed. Take one kit by mouth as directed.  Indications: emptying of the bowel   traMADoL (ULTRAM) 50 mg tablet Take 50 mg by mouth every 8 hours as needed for Pain.         Review of Systems/Medical History  Patient summary reviewed  Nursing notes reviewed  Pertinent labs reviewed    PONV Screening: Female gender and Non-smoker  No history of anesthetic complications  No family history of anesthetic complications        Pulmonary       Not a current smoker        No indications/hx of asthma    no COPD      No sleep apnea      Cardiovascular         Exercise tolerance: >4 METS      No hypertension,       No past MI,       No palpitations      No angina      No hyperlipidemia      8 METs per DASI      GI/Hepatic/Renal           GERD, well controlled      No hx of liver disease     No renal disease      Neuro/Psych       No seizures      No hx neuromuscular disease      No CVA        Psychiatric history          ADHD          Anxiety        Endocrine/Other       No diabetes      No hypothyroidism      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    Abdominal Findings:         Abdominal exam deferred    Neurological Findings:       Alert and oriented x 3    Constitutional findings:       No acute distress      Well-developed      Well-nourished       Diagnostic Tests  Hematology:   Lab Results   Component Value Date    HGB 14.3 10/16/2020    HCT 42.7 10/16/2020    PLTCT 248 10/16/2020    WBC 8.6 10/16/2020    NEUT 65 10/16/2020    ANC 5.70 10/16/2020    ALC 2.00 10/16/2020    MONA 9 10/16/2020    AMC 0.80 10/16/2020    EOSA 1 10/16/2020    ABC 0.00 10/16/2020    MCV 84.7 10/16/2020    MCH 28.4 10/16/2020    MCHC 33.5 10/16/2020    MPV 6.5 10/16/2020    RDW 15.5 10/16/2020         General Chemistry:   Lab Results   Component Value Date    NA 140 10/16/2020    K 3.8 10/16/2020    CL 102 10/16/2020    CO2 26 10/16/2020  GAP 12 10/16/2020    BUN 11 10/16/2020    CR 0.85 10/16/2020    GLU 106 10/16/2020    CA 9.4 10/16/2020    ALBUMIN 4.5 10/16/2020    TOTBILI 0.4 10/16/2020      Coagulation:   Lab Results   Component Value Date    PT 12.5 08/21/2020    PTT 31.3 08/21/2020    INR 1.1 08/21/2020     CT Chest 10/25/20  1. No pulmonary metastatic disease.   2. Decrease in size of a mildly enlarged pericardial lymph node suggestive interval response to therapy by a nodal metastasis.     CT Abd/Pelvis 10/25/20  Decrease in size of the bilateral adnexal masses, peritoneal metastases, and malignant ascites suggesting favorable response to therapy by the patient's metastatic ovarian cancer.     Anesthesia Plan    ASA score: 2   Plan: MAC

## 2020-11-10 NOTE — Telephone Encounter
FMLA forms completed and faxed to employer as requested.  Confirmation of fax sending received.  Copy of forms also sent to patient via mychart message.

## 2020-11-13 ENCOUNTER — Encounter: Admit: 2020-11-13 | Discharge: 2020-11-13 | Payer: BC Managed Care – PPO

## 2020-11-13 ENCOUNTER — Ambulatory Visit: Admit: 2020-11-13 | Discharge: 2020-11-13 | Payer: BC Managed Care – PPO

## 2020-11-13 DIAGNOSIS — C569 Malignant neoplasm of unspecified ovary: Secondary | ICD-10-CM

## 2020-11-13 DIAGNOSIS — C786 Secondary malignant neoplasm of retroperitoneum and peritoneum: Secondary | ICD-10-CM

## 2020-11-13 MED ORDER — LIDOCAINE (PF) 20 MG/ML (2 %) IJ SOLN
INTRAVENOUS | 0 refills | Status: DC
Start: 2020-11-13 — End: 2020-11-13
  Administered 2020-11-13: 20:00:00 80 mg via INTRAVENOUS

## 2020-11-13 MED ORDER — PROPOFOL 10 MG/ML IV EMUL 20 ML (INFUSION)(AM)(OR)
INTRAVENOUS | 0 refills | Status: DC
Start: 2020-11-13 — End: 2020-11-13
  Administered 2020-11-13: 20:00:00 140 ug/kg/min via INTRAVENOUS

## 2020-11-13 MED ORDER — PROPOFOL INJ 10 MG/ML IV VIAL
INTRAVENOUS | 0 refills | Status: DC
Start: 2020-11-13 — End: 2020-11-13
  Administered 2020-11-13: 20:00:00 30 mg via INTRAVENOUS
  Administered 2020-11-13 (×2): 50 mg via INTRAVENOUS
  Administered 2020-11-13: 20:00:00 30 mg via INTRAVENOUS

## 2020-11-13 MED ADMIN — LACTATED RINGERS IV SOLP [4318]: 1000.000 mL | INTRAVENOUS | @ 19:00:00 | Stop: 2020-11-13 | NDC 00338011704

## 2020-11-13 NOTE — Telephone Encounter
Received an incoming VM from staff in GI Endoscopy stating patient has not heard what time she needs to check in for surgery tomorrow. This RN called patient to make sure she has heard from our [place] about what time to check in. Patient got a voicemail while she was having her GI procedure with what time to check in. Told patient she should continue with liquid diet today in preparation for surgery tomorrow. Patient verbalizes understanding and has no questions.

## 2020-11-14 ENCOUNTER — Encounter: Admit: 2020-11-14 | Discharge: 2020-11-14 | Payer: BC Managed Care – PPO

## 2020-11-14 ENCOUNTER — Inpatient Hospital Stay: Admit: 2020-11-14 | Discharge: 2020-11-14 | Payer: BC Managed Care – PPO

## 2020-11-14 DIAGNOSIS — C569 Malignant neoplasm of unspecified ovary: Secondary | ICD-10-CM

## 2020-11-14 DIAGNOSIS — U071 COVID: Secondary | ICD-10-CM

## 2020-11-14 DIAGNOSIS — R000 Tachycardia, unspecified: Secondary | ICD-10-CM

## 2020-11-14 MED ORDER — HYDROMORPHONE (PF) 2 MG/ML IJ SYRG
INTRAVENOUS | 0 refills | Status: DC
Start: 2020-11-14 — End: 2020-11-14
  Administered 2020-11-14 (×2): .5 mg via INTRAVENOUS

## 2020-11-14 MED ORDER — ROCURONIUM 10 MG/ML IV SOLN
INTRAVENOUS | 0 refills | Status: DC
Start: 2020-11-14 — End: 2020-11-14
  Administered 2020-11-14: 14:00:00 50 mg via INTRAVENOUS

## 2020-11-14 MED ORDER — PHENYLEPHRINE HCL IN 0.9% NACL 1 MG/10 ML (100 MCG/ML) IV SYRG
INTRAVENOUS | 0 refills | Status: DC
Start: 2020-11-14 — End: 2020-11-14
  Administered 2020-11-14 (×2): 100 ug via INTRAVENOUS
  Administered 2020-11-14: 17:00:00 50 ug via INTRAVENOUS

## 2020-11-14 MED ORDER — LACTATED RINGERS IV SOLP
INTRAVENOUS | 0 refills | Status: DC
Start: 2020-11-14 — End: 2020-11-14
  Administered 2020-11-14 (×2): via INTRAVENOUS

## 2020-11-14 MED ORDER — ONDANSETRON HCL (PF) 4 MG/2 ML IJ SOLN
INTRAVENOUS | 0 refills | Status: DC
Start: 2020-11-14 — End: 2020-11-14
  Administered 2020-11-14: 17:00:00 16 mg via INTRAVENOUS

## 2020-11-14 MED ORDER — FENTANYL CITRATE (PF) 50 MCG/ML IJ SOLN
INTRAVENOUS | 0 refills | Status: CP
Start: 2020-11-14 — End: ?
  Administered 2020-11-14: 13:00:00 100 ug via INTRAVENOUS

## 2020-11-14 MED ORDER — DEXMEDETOMIDINE IN 0.9 % NACL 20 MCG/5 ML (4 MCG/ML) IV SYRG
INTRAVENOUS | 0 refills | Status: DC
Start: 2020-11-14 — End: 2020-11-14
  Administered 2020-11-14: 14:00:00 12 ug via INTRAVENOUS
  Administered 2020-11-14: 17:00:00 8 ug via INTRAVENOUS

## 2020-11-14 MED ORDER — LIDOCAINE-EPINEPHRINE (PF) 1.5 %-1:200,000 IJ SOLN (OR)
0 refills | Status: CP
Start: 2020-11-14 — End: ?

## 2020-11-14 MED ORDER — DEXAMETHASONE SODIUM PHOSPHATE 4 MG/ML IJ SOLN
INTRAVENOUS | 0 refills | Status: DC
Start: 2020-11-14 — End: 2020-11-14
  Administered 2020-11-14: 14:00:00 12 mg via INTRAVENOUS

## 2020-11-14 MED ORDER — LIDOCAINE (PF) 10 MG/ML (1 %) IJ SOLN
INTRAMUSCULAR | 0 refills | Status: CP
Start: 2020-11-14 — End: ?
  Administered 2020-11-14: 13:00:00 2 mL via INTRAMUSCULAR

## 2020-11-14 MED ORDER — SUGAMMADEX 100 MG/ML IV SOLN
INTRAVENOUS | 0 refills | Status: DC
Start: 2020-11-14 — End: 2020-11-14
  Administered 2020-11-14: 20:00:00 200 mg via INTRAVENOUS
  Administered 2020-11-14: 20:00:00 300 mg via INTRAVENOUS

## 2020-11-14 MED ORDER — ALBUMIN, HUMAN 5 % 250 ML IV SOLP (AN)(OSM)
INTRAVENOUS | 0 refills | Status: DC
Start: 2020-11-14 — End: 2020-11-14
  Administered 2020-11-14: 15:00:00 via INTRAVENOUS
  Administered 2020-11-14 (×2): 0 via INTRAVENOUS
  Administered 2020-11-14: 19:00:00 via INTRAVENOUS

## 2020-11-14 MED ORDER — CEFAZOLIN 1 GRAM IJ SOLR
INTRAVENOUS | 0 refills | Status: DC
Start: 2020-11-14 — End: 2020-11-14
  Administered 2020-11-14 (×2): 2 g via INTRAVENOUS

## 2020-11-14 MED ORDER — LACTATED RINGERS IV SOLP
INTRAVENOUS | 0 refills | Status: DC
Start: 2020-11-14 — End: 2020-11-14
  Administered 2020-11-14 (×4): via INTRAVENOUS

## 2020-11-14 MED ORDER — ELECTROLYTE-A IV SOLP
INTRAVENOUS | 0 refills | Status: DC
Start: 2020-11-14 — End: 2020-11-14
  Administered 2020-11-14: 14:00:00 via INTRAVENOUS

## 2020-11-14 MED ORDER — MIDAZOLAM 1 MG/ML IJ SOLN
INTRAVENOUS | 0 refills | Status: CP
Start: 2020-11-14 — End: ?
  Administered 2020-11-14: 13:00:00 2 mg via INTRAVENOUS

## 2020-11-14 MED ORDER — ARTIFICIAL TEARS (PF) SINGLE DOSE DROPS GROUP
OPHTHALMIC | 0 refills | Status: DC
Start: 2020-11-14 — End: 2020-11-14
  Administered 2020-11-14: 14:00:00 2 [drp] via OPHTHALMIC

## 2020-11-14 MED ORDER — VECURONIUM BROMIDE 10 MG IV SOLR
INTRAVENOUS | 0 refills | Status: DC
Start: 2020-11-14 — End: 2020-11-14
  Administered 2020-11-14: 16:00:00 2 mg via INTRAVENOUS
  Administered 2020-11-14 (×2): 4 mg via INTRAVENOUS

## 2020-11-14 MED ADMIN — SODIUM THIOSULFATE 12.5 GRAM/50 ML (250 MG/ML) IV SOLN [7364]: 25.08 g | INTRAVENOUS | @ 18:00:00 | Stop: 2020-11-14 | NDC 60267070550

## 2020-11-14 MED ADMIN — SODIUM PHOSPHATE 3 MMOL/ML IV SOLN [7351]: 30 mmol | INTRAVENOUS | Stop: 2020-11-15 | NDC 63323088101

## 2020-11-14 MED ADMIN — BUPIVACAINE 0.0625% 50ML PCA EPIDURAL SYRINGE [211821]: 50.000 mL | EPIDURAL | @ 17:00:00 | NDC 54029248909

## 2020-11-14 MED ADMIN — GABAPENTIN 300 MG PO CAP [18308]: 600 mg | ORAL | @ 12:00:00 | Stop: 2020-11-14 | NDC 67877022305

## 2020-11-14 MED ADMIN — SODIUM THIOSULFATE 12.5 GRAM/50 ML (250 MG/ML) IV SOLN [7364]: 275.24 g | INTRAVENOUS | @ 17:00:00 | Stop: 2020-11-14 | NDC 60267070550

## 2020-11-14 MED ADMIN — INSULIN ASPART 100 UNIT/ML SC FLEXPEN [87504]: 3 [IU] | SUBCUTANEOUS | @ 22:00:00 | Stop: 2020-11-14 | NDC 00169633910

## 2020-11-14 MED ADMIN — CELECOXIB 200 MG PO CAP [76958]: 200 mg | ORAL | @ 12:00:00 | Stop: 2020-11-14 | NDC 00904650361

## 2020-11-14 MED ADMIN — DEXTROSE 5% IN WATER IV SOLP [2364]: 30 mmol | INTRAVENOUS | Stop: 2020-11-15 | NDC 00338001702

## 2020-11-14 MED ADMIN — ACETAMINOPHEN 1,000 MG/100 ML (10 MG/ML) IV SOLN [305632]: 1000 mg | INTRAVENOUS | @ 23:00:00 | Stop: 2020-11-14 | NDC 24201010001

## 2020-11-14 MED ADMIN — DEXTROSE 5% IN WATER IV SOLP [2364]: 275.24 g | INTRAVENOUS | @ 17:00:00 | Stop: 2020-11-14 | NDC 00338001704

## 2020-11-14 MED ADMIN — SODIUM CHLORIDE 0.9 % IV SOLP [27838]: 1000.000 mL | INTRAVENOUS | @ 22:00:00 | Stop: 2020-11-15 | NDC 00338004904

## 2020-11-14 MED ADMIN — DEXTROSE 5% IN WATER IV SOLP [2364]: 25.08 g | INTRAVENOUS | @ 18:00:00 | Stop: 2020-11-14 | NDC 00338001704

## 2020-11-14 MED ADMIN — LACTATED RINGERS IV SOLP [4318]: 1000 mL | INTRAVENOUS | @ 23:00:00 | Stop: 2020-11-14 | NDC 00338011704

## 2020-11-14 MED ADMIN — FENTANYL CITRATE (PF) 50 MCG/ML IJ SOLN [3037]: 50 ug | INTRAVENOUS | NDC 00409909412

## 2020-11-14 MED ADMIN — MAGNESIUM SULFATE IN WATER 4 GRAM/50 ML (8 %) IV PGBK [166563]: 4 g | INTRAVENOUS | Stop: 2020-11-15 | NDC 00338171940

## 2020-11-14 MED ADMIN — ACETAMINOPHEN 500 MG PO TAB [102]: 1000 mg | ORAL | @ 12:00:00 | Stop: 2020-11-14 | NDC 00904673080

## 2020-11-14 MED ADMIN — SODIUM CHLORIDE 0.9 % IV SOLP [27838]: 500.000 mL | INTRAVENOUS | @ 12:00:00 | Stop: 2020-11-14 | NDC 00338004902

## 2020-11-14 MED ADMIN — PANTOPRAZOLE 40 MG IV SOLR [78621]: 40 mg | INTRAVENOUS | @ 22:00:00 | NDC 55150020200

## 2020-11-14 MED ADMIN — METRONIDAZOLE IN NACL (ISO-OS) 500 MG/100 ML IV PGBK [5018]: 500 mg | INTRAVENOUS | @ 12:00:00 | Stop: 2020-11-14 | NDC 47335099301

## 2020-11-14 MED ADMIN — BUPIVACAINE 0.0625% 50ML PCA EPIDURAL SYRINGE [211821]: 50.000 mL | EPIDURAL | @ 22:00:00 | NDC 54029248909

## 2020-11-15 ENCOUNTER — Encounter: Admit: 2020-11-15 | Discharge: 2020-11-15 | Payer: BC Managed Care – PPO

## 2020-11-15 MED ADMIN — FENTANYL CITRATE (PF) 50 MCG/ML IJ SOLN [3037]: 50 ug | INTRAVENOUS | @ 02:00:00 | NDC 00409909412

## 2020-11-15 MED ADMIN — FENTANYL CITRATE (PF) 50 MCG/ML IJ SOLN [3037]: 50 ug | INTRAVENOUS | @ 07:00:00 | NDC 00409909412

## 2020-11-15 MED ADMIN — BUPIVACAINE (PF) 0.5 % (5 MG/ML) IJ SOLN [87867]: 50.000 mL | EPIDURAL | @ 19:00:00 | NDC 63323046603

## 2020-11-15 MED ADMIN — ONDANSETRON HCL (PF) 4 MG/2 ML IJ SOLN [136012]: 4 mg | INTRAVENOUS | @ 22:00:00 | NDC 72266012301

## 2020-11-15 MED ADMIN — BUPIVACAINE 0.0625% 50ML PCA EPIDURAL SYRINGE [211821]: 50.000 mL | EPIDURAL | @ 04:00:00 | NDC 54029248909

## 2020-11-15 MED ADMIN — SODIUM CHLORIDE 0.9 % IV SOLP [27838]: 1000.000 mL | INTRAVENOUS | @ 08:00:00 | Stop: 2020-11-15 | NDC 00338004904

## 2020-11-15 MED ADMIN — ACETAMINOPHEN 1,000 MG/100 ML (10 MG/ML) IV SOLN [305632]: 1000 mg | INTRAVENOUS | @ 17:00:00 | Stop: 2020-11-16 | NDC 24201010001

## 2020-11-15 MED ADMIN — SODIUM CHLORIDE 0.9 % IV SOLP [27838]: 50.000 mL | EPIDURAL | @ 22:00:00 | NDC 00338004931

## 2020-11-15 MED ADMIN — BUPIVACAINE 0.0625% 50ML PCA EPIDURAL SYRINGE [211821]: 50.000 mL | EPIDURAL | @ 07:00:00 | Stop: 2020-11-15 | NDC 54029248909

## 2020-11-15 MED ADMIN — FENTANYL CITRATE (PF) 50 MCG/ML IJ SOLN [3037]: 50 ug | INTRAVENOUS | @ 05:00:00 | NDC 00409909412

## 2020-11-15 MED ADMIN — KETOROLAC 15 MG/ML IJ SOLN [22472]: 15 mg | INTRAVENOUS | @ 09:00:00 | Stop: 2020-11-15 | NDC 72611071901

## 2020-11-15 MED ADMIN — POTASSIUM CHLORIDE IN WATER 10 MEQ/50 ML IV PGBK [11075]: 10 meq | INTRAVENOUS | @ 15:00:00 | Stop: 2020-11-15 | NDC 00338070541

## 2020-11-15 MED ADMIN — HYDROMORPHONE (PF) 1 MG/ML IJ SOLN [3757]: 50.000 mL | EPIDURAL | @ 22:00:00 | NDC 63323085203

## 2020-11-15 MED ADMIN — DEXAMETHASONE 4 MG PO TAB [2327]: 8 mg | ORAL | @ 15:00:00 | Stop: 2020-11-16 | NDC 00054418425

## 2020-11-15 MED ADMIN — KETOROLAC 15 MG/ML IJ SOLN [22472]: 15 mg | INTRAVENOUS | @ 15:00:00 | Stop: 2020-11-15 | NDC 72611071901

## 2020-11-15 MED ADMIN — ONDANSETRON HCL (PF) 4 MG/2 ML IJ SOLN [136012]: 4 mg | INTRAVENOUS | @ 02:00:00 | NDC 72266012301

## 2020-11-15 MED ADMIN — ONDANSETRON HCL (PF) 4 MG/2 ML IJ SOLN [136012]: 4 mg | INTRAVENOUS | @ 13:00:00 | NDC 72266012301

## 2020-11-15 MED ADMIN — SODIUM CHLORIDE 0.9 % IV SOLP [27838]: 50.000 mL | EPIDURAL | @ 19:00:00 | NDC 00338004931

## 2020-11-15 MED ADMIN — BUPIVACAINE 0.0625% 50ML PCA EPIDURAL SYRINGE [211821]: 50.000 mL | EPIDURAL | @ 12:00:00 | Stop: 2020-11-15 | NDC 54029248909

## 2020-11-15 MED ADMIN — FENTANYL CITRATE (PF) 50 MCG/ML IJ SOLN [3037]: 50 ug | INTRAVENOUS | @ 17:00:00 | NDC 00409909412

## 2020-11-15 MED ADMIN — PANTOPRAZOLE 40 MG IV SOLR [78621]: 40 mg | INTRAVENOUS | @ 15:00:00 | NDC 55150020200

## 2020-11-15 MED ADMIN — FENTANYL CITRATE (PF) 50 MCG/ML IJ SOLN [3037]: 50 ug | INTRAVENOUS | @ 13:00:00 | NDC 00409909412

## 2020-11-15 MED ADMIN — FENTANYL CITRATE (PF) 50 MCG/ML IJ SOLN [3037]: 50 ug | INTRAVENOUS | @ 11:00:00 | NDC 00409909412

## 2020-11-15 MED ADMIN — SODIUM CHLORIDE 0.9 % IV SOLP [27838]: 50.000 mL | EPIDURAL | @ 13:00:00 | NDC 00338004931

## 2020-11-15 MED ADMIN — KETOROLAC 15 MG/ML IJ SOLN [22472]: 15 mg | INTRAVENOUS | @ 03:00:00 | Stop: 2020-11-15 | NDC 72611071901

## 2020-11-15 MED ADMIN — HYDROMORPHONE (PF) 1 MG/ML IJ SOLN [3757]: 50.000 mL | EPIDURAL | @ 13:00:00 | NDC 63323085203

## 2020-11-15 MED ADMIN — BUPIVACAINE (PF) 0.5 % (5 MG/ML) IJ SOLN [87867]: 50.000 mL | EPIDURAL | @ 22:00:00 | NDC 63323046603

## 2020-11-15 MED ADMIN — ACETAMINOPHEN 1,000 MG/100 ML (10 MG/ML) IV SOLN [305632]: 1000 mg | INTRAVENOUS | @ 22:00:00 | Stop: 2020-11-16 | NDC 24201010001

## 2020-11-15 MED ADMIN — TRAMADOL 50 MG PO TAB [14632]: 50 mg | ORAL | @ 15:00:00 | NDC 65162062710

## 2020-11-15 MED ADMIN — POTASSIUM CHLORIDE IN WATER 10 MEQ/50 ML IV PGBK [11075]: 10 meq | INTRAVENOUS | @ 10:00:00 | Stop: 2020-11-15 | NDC 00338070541

## 2020-11-15 MED ADMIN — POTASSIUM CHLORIDE IN WATER 10 MEQ/50 ML IV PGBK [11075]: 10 meq | INTRAVENOUS | @ 17:00:00 | Stop: 2020-11-15 | NDC 00338070541

## 2020-11-15 MED ADMIN — HYDROMORPHONE (PF) 1 MG/ML IJ SOLN [3757]: 50.000 mL | EPIDURAL | @ 19:00:00 | NDC 63323085203

## 2020-11-15 MED ADMIN — BUPIVACAINE (PF) 0.5 % (5 MG/ML) IJ SOLN [87867]: 50.000 mL | EPIDURAL | @ 13:00:00 | NDC 63323046603

## 2020-11-15 MED ADMIN — ENOXAPARIN 40 MG/0.4 ML SC SYRG [85052]: 40 mg | SUBCUTANEOUS | @ 15:00:00 | NDC 00781324602

## 2020-11-15 MED ADMIN — SCOPOLAMINE BASE 1 MG OVER 3 DAYS TD PT3D [82141]: 1 | TRANSDERMAL | @ 19:00:00 | NDC 50742050501

## 2020-11-15 MED ADMIN — SODIUM CHLORIDE 0.9 % IV SOLP [27838]: 1000.000 mL | INTRAVENOUS | @ 17:00:00 | Stop: 2020-11-15 | NDC 00338004904

## 2020-11-16 ENCOUNTER — Encounter: Admit: 2020-11-16 | Discharge: 2020-11-16 | Payer: BC Managed Care – PPO

## 2020-11-16 DIAGNOSIS — U071 COVID: Secondary | ICD-10-CM

## 2020-11-16 DIAGNOSIS — C569 Malignant neoplasm of unspecified ovary: Secondary | ICD-10-CM

## 2020-11-16 DIAGNOSIS — R000 Tachycardia, unspecified: Secondary | ICD-10-CM

## 2020-11-16 MED ADMIN — HYDROMORPHONE (PF) 1 MG/ML IJ SOLN [3757]: 50.000 mL | EPIDURAL | @ 11:00:00 | NDC 63323085203

## 2020-11-16 MED ADMIN — ENOXAPARIN 40 MG/0.4 ML SC SYRG [85052]: 40 mg | SUBCUTANEOUS | @ 14:00:00 | NDC 00781324602

## 2020-11-16 MED ADMIN — BUPIVACAINE (PF) 0.5 % (5 MG/ML) IJ SOLN [87867]: 50.000 mL | EPIDURAL | @ 19:00:00 | NDC 63323046603

## 2020-11-16 MED ADMIN — ACETAMINOPHEN 1,000 MG/100 ML (10 MG/ML) IV SOLN [305632]: 1000 mg | INTRAVENOUS | @ 04:00:00 | Stop: 2020-11-16 | NDC 24201010001

## 2020-11-16 MED ADMIN — HYDROMORPHONE (PF) 1 MG/ML IJ SOLN [3757]: 50.000 mL | EPIDURAL | NDC 63323085203

## 2020-11-16 MED ADMIN — LACTATED RINGERS IV SOLP [4318]: 1000.000 mL | INTRAVENOUS | @ 15:00:00 | NDC 00338011704

## 2020-11-16 MED ADMIN — HYDROMORPHONE (PF) 1 MG/ML IJ SOLN [3757]: 50.000 mL | EPIDURAL | @ 19:00:00 | NDC 63323085203

## 2020-11-16 MED ADMIN — DEXAMETHASONE SODIUM PHOSPHATE 10 MG/ML IJ SOLN [2331]: 8 mg | INTRAVENOUS | @ 14:00:00 | Stop: 2020-11-18 | NDC 00641036721

## 2020-11-16 MED ADMIN — PANTOPRAZOLE 40 MG IV SOLR [78621]: 40 mg | INTRAVENOUS | @ 14:00:00 | NDC 55150020200

## 2020-11-16 MED ADMIN — ONDANSETRON HCL (PF) 4 MG/2 ML IJ SOLN [136012]: 4 mg | INTRAVENOUS | NDC 36000001225

## 2020-11-16 MED ADMIN — DEXAMETHASONE SODIUM PHOSPHATE 10 MG/ML IJ SOLN [2331]: 8 mg | INTRAVENOUS | @ 01:00:00 | Stop: 2020-11-18 | NDC 00641036721

## 2020-11-16 MED ADMIN — HYDROMORPHONE (PF) 1 MG/ML IJ SOLN [3757]: 50.000 mL | EPIDURAL | @ 03:00:00 | NDC 63323085203

## 2020-11-16 MED ADMIN — ACETAMINOPHEN 1,000 MG/100 ML (10 MG/ML) IV SOLN [305632]: 1000 mg | INTRAVENOUS | @ 16:00:00 | Stop: 2020-11-17 | NDC 24201010001

## 2020-11-16 MED ADMIN — SODIUM CHLORIDE 0.9 % IV SOLP [27838]: 50.000 mL | EPIDURAL | @ 03:00:00 | NDC 00338004931

## 2020-11-16 MED ADMIN — SODIUM CHLORIDE 0.9 % IV SOLP [27838]: 50.000 mL | EPIDURAL | NDC 00338004931

## 2020-11-16 MED ADMIN — BUPIVACAINE (PF) 0.5 % (5 MG/ML) IJ SOLN [87867]: 50.000 mL | EPIDURAL | NDC 63323046603

## 2020-11-16 MED ADMIN — MAGNESIUM SULFATE IN D5W 1 GRAM/100 ML IV PGBK [166578]: 1 g | INTRAVENOUS | @ 11:00:00 | Stop: 2020-11-16 | NDC 00338170940

## 2020-11-16 MED ADMIN — SODIUM CHLORIDE 0.9 % IV SOLP [27838]: 50.000 mL | EPIDURAL | @ 19:00:00 | NDC 00338004931

## 2020-11-16 MED ADMIN — BUPIVACAINE (PF) 0.5 % (5 MG/ML) IJ SOLN [87867]: 50.000 mL | EPIDURAL | @ 11:00:00 | NDC 63323046603

## 2020-11-16 MED ADMIN — BUPIVACAINE (PF) 0.5 % (5 MG/ML) IJ SOLN [87867]: 50.000 mL | EPIDURAL | @ 03:00:00 | NDC 63323046603

## 2020-11-16 MED ADMIN — LACTATED RINGERS IV SOLP [4318]: 1000.000 mL | INTRAVENOUS | NDC 00338011704

## 2020-11-16 MED ADMIN — SODIUM CHLORIDE 0.9 % IV SOLP [27838]: 50.000 mL | EPIDURAL | @ 11:00:00 | NDC 00338004931

## 2020-11-16 NOTE — Progress Notes
Tumor Conference Date: 11/16/20  Patient:           Kristina Velazquez    Med Rec #:    [id number]      DOB:      1990-03-05               Presenting Physician: Resident team  Attending Physician: Dr. 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 Weight: 204 lb BMI: 32.0  ECOG performance status: 0  Personal History of Cancer: No  Family History of Cancer:   Father - Renal cell carcinoma   Mother - breast cancer (BRCA+), primary peritoneal carcinoma      Prior treatment (XRT, surgery, chemotherapy, etc.):   4 cycles of NACT with carbo/taxol, TAH, BSO, omentectomy, appendectomy, optimal tumor debulking, HIPEC    Presenting significant physical exam:   External genitalia, anus, perineum, urethral meatus, urethra, bladder and vagina normal. Cervix, uterus normal size. Right adnexa enlarged, firm, nodular, unable to palpable left adnexa. Bimanual and rectovaginal exam     Results of diagnostic procedures/radiological procedures (CT, MRI, PET):  CT scan of the chest, abdomen, and pelvis (10/25/2020)    CLINICAL HISTORY: Malignant neoplasm of ovary, unspecified laterality;   ovarian cancer, evaluation of disease process     TECHNIQUE: Multiple contiguous axial images were obtained through the   chest, abdomen, and pelvis after the IV administration of Omnipaque 350   contrast material. Image postprocessing coronal and sagittal   reconstructions were obtained from the source axial data.     COMPARISON: Prior CT scan of the abdomen and pelvis from an outside   institution August 08, 2020     FINDINGS:     CHEST:     Lower neck: Unremarkable     Heart and great vessels: Indwelling left subclavian chest port catheter   which terminates in the upper aspect of the right atrium. Heart size   normal. Thoracic aorta normal in caliber.     Mediastinum and pulmonary hila: Decrease in size of a still mildly   enlarged pericardial lymph node, as demonstrated in image 2/93. No new   sites of lymphadenopathy.     Central airways, lungs, and pleura: Central airways are unremarkable. No   suspicious pulmonary nodule or pleural effusion.     Osseous structures and chest wall: No destructive osseous lesion or   axillary lymphadenopathy.     ABDOMEN AND PELVIS:     Liver and biliary system: Mild hepatic steatosis. No focal hepatic lesion.   Status post cholecystectomy without biliary ductal dilation. The major   portal veins are patent.     Spleen: Unremarkable     Adrenal glands and kidneys: Unremarkable apart from bilateral   nonobstructive nephrolithiasis.     Pancreas and retroperitoneum: Pancreas unremarkable. No retroperitoneal   lymphadenopathy.     Abdominal aorta and major vessels: Unremarkable  Bowel, mesentery, and peritoneal space: The bowel loops are nondistended.   Improvement in now minimal pelvic ascites. There has been decrease in size   of the peritoneal metastases. A representative metastasis within the mid   aspect of the right hemiabdomen in the subhepatic space measures 3.4 x 7.4   cm in image 3/76 versus 6.9 x 9.9 cm previously.     Pelvis: The urinary bladder is decompressed. Uterus is grossly   unremarkable apart from mild prominence of the endometrial stripe. There   has been decrease in size of the bilateral adnexal masses. No pelvic   lymphadenopathy. Osseous structures and body wall: No destructive osseous lesion.     IMPRESSION       CHEST:   1. No pulmonary metastatic disease.   2. Decrease in size of a mildly enlarged pericardial lymph node suggestive   interval response to therapy by a nodal metastasis.     ABDOMEN AND PELVIS:   Decrease in size of the bilateral adnexal masses, peritoneal metastases,   and malignant ascites suggesting favorable response to therapy by the   patient's metastatic ovarian cancer.    Surgery: : Ex-lap, TAH, BSO, omentectomy, appendectomy, optimal tumor debulking, HIPEC      Lab: Initial tumor markers prior to treatment:   Genetic screening: Pathogenic mutation in the BRCA1 and PMS2 gene  Variant of Uncertain Significance in the ATM and MLH1 gene      Pathology:   Final Diagnosis (08/16/20):     A. Lesional tissue, abdominal mass bx, biopsy:     High-grade serous carcinoma. See comment.     Comment:   Immunohistochemical studies performed on block A1 show tumor cells to be   positive for p53, WT-1, and PAX-8. They are negative for p16.     Pursuant to the Quality Assurance Program at the [facility] Pathology Department, selected slides from this case have been   concurrently reviewed by the following pathologist: Dr. Margurite Auerbach, who   agrees with the final diagnosis.     Addendum Diagnosis   Biomarker testing     Testing performed on block number: A1   Manual Immunohistochemistry (IHC) Semi-Quantitative Analysis:     Estrogen Receptor (ER):          85%     Positive       Progesterone Receptor (PR):     10%     Positive        Ki-67:                    25%   p53:                    90%     Abnormal strong diffuse overexpression   (>90%)     Criteria for reporting ER/PR results:   Positive: >=1%, based on visual estimation or quantitation of positive   cells (nuclear staining) in entire section, noting if variation is   present.   Negative: <1% of positive cells   Roche Estrogen Receptor antibody, Clone SP1, is FDA cleared.  Dako   Progesterone Receptor antibody, Clone 636, is FDA cleared. Dako p53 uses   Clone DO-7 as primary antibody. Dako Ki-67 uses Clone MIB-1 as primary   antibody.   Detection system: DAKO Envision FLEX + Detection Kit    ---   Final Diagnosis (08/16/20):   A. Abdominal Fluid:     Metastatic carcinoma consistent with Mullerian  origin.   Please also see concurrent surgical pathology report ([id number]) for   further characterization.     Comment:   A cell block is prepared and examined microscopically; it contains   sufficient tissue for further testing. Immunohistochemical stains show the   tumor cellsare positive for MOC-31, Ber-EP4, WT-1, Pax-8, and p53   (diffuse, mutated pattern); and negative for calretinin supporting the   diagnosis.              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

## 2020-11-17 ENCOUNTER — Encounter: Admit: 2020-11-17 | Discharge: 2020-11-17 | Payer: BC Managed Care – PPO

## 2020-11-17 MED ADMIN — ACETAMINOPHEN 500 MG PO TAB [102]: 1000 mg | ORAL | @ 14:00:00 | NDC 00904673061

## 2020-11-17 MED ADMIN — MAGNESIUM OXIDE 400 MG (241.3 MG MAGNESIUM) PO TAB [10491]: 200 mg | ORAL | @ 14:00:00 | NDC 10006070028

## 2020-11-17 MED ADMIN — IBUPROFEN 600 MG PO TAB [3844]: 600 mg | ORAL | @ 18:00:00 | NDC 00904585461

## 2020-11-17 MED ADMIN — KETOROLAC 15 MG/ML IJ SOLN [22472]: 15 mg | INTRAVENOUS | @ 05:00:00 | Stop: 2020-11-17 | NDC 72611071901

## 2020-11-17 MED ADMIN — HYDROMORPHONE (PF) 1 MG/ML IJ SOLN [3757]: 50.000 mL | EPIDURAL | @ 07:00:00 | Stop: 2020-11-17 | NDC 63323085203

## 2020-11-17 MED ADMIN — BUPIVACAINE (PF) 0.5 % (5 MG/ML) IJ SOLN [87867]: 50.000 mL | EPIDURAL | @ 07:00:00 | Stop: 2020-11-17 | NDC 63323046603

## 2020-11-17 MED ADMIN — DEXAMETHASONE SODIUM PHOSPHATE 10 MG/ML IJ SOLN [2331]: 8 mg | INTRAVENOUS | @ 14:00:00 | Stop: 2020-11-18 | NDC 00641036721

## 2020-11-17 MED ADMIN — ACETAMINOPHEN 1,000 MG/100 ML (10 MG/ML) IV SOLN [305632]: 1000 mg | INTRAVENOUS | @ 02:00:00 | Stop: 2020-11-17 | NDC 24201010001

## 2020-11-17 MED ADMIN — LACTATED RINGERS IV SOLP [4318]: 1000.000 mL | INTRAVENOUS | @ 03:00:00 | NDC 00338011704

## 2020-11-17 MED ADMIN — ACETAMINOPHEN 500 MG PO TAB [102]: 1000 mg | ORAL | @ 21:00:00 | NDC 00904673061

## 2020-11-17 MED ADMIN — DEXAMETHASONE SODIUM PHOSPHATE 10 MG/ML IJ SOLN [2331]: 8 mg | INTRAVENOUS | @ 02:00:00 | Stop: 2020-11-18 | NDC 00641036721

## 2020-11-17 MED ADMIN — PANTOPRAZOLE 40 MG IV SOLR [78621]: 40 mg | INTRAVENOUS | @ 14:00:00 | NDC 55150020200

## 2020-11-17 MED ADMIN — SODIUM CHLORIDE 0.9 % IV SOLP [27838]: 50.000 mL | EPIDURAL | @ 07:00:00 | Stop: 2020-11-17 | NDC 00338004904

## 2020-11-17 MED ADMIN — IBUPROFEN 600 MG PO TAB [3844]: 600 mg | ORAL | @ 12:00:00 | NDC 67877032001

## 2020-11-18 MED ADMIN — MAGNESIUM OXIDE 400 MG (241.3 MG MAGNESIUM) PO TAB [10491]: 200 mg | ORAL | @ 14:00:00 | NDC 10006070028

## 2020-11-18 MED ADMIN — ACETAMINOPHEN 500 MG PO TAB [102]: 1000 mg | ORAL | @ 19:00:00 | NDC 00904673061

## 2020-11-18 MED ADMIN — IBUPROFEN 600 MG PO TAB [3844]: 600 mg | ORAL | @ 01:00:00 | NDC 00904585461

## 2020-11-18 MED ADMIN — PANTOPRAZOLE 40 MG IV SOLR [78621]: 40 mg | INTRAVENOUS | @ 14:00:00 | NDC 55150020200

## 2020-11-18 MED ADMIN — METOCLOPRAMIDE HCL 5 MG/ML IJ SOLN [5002]: 10 mg | INTRAVENOUS | @ 23:00:00 | NDC 00409341418

## 2020-11-18 MED ADMIN — ACETAMINOPHEN 500 MG PO TAB [102]: 1000 mg | ORAL | @ 03:00:00 | NDC 00904673061

## 2020-11-18 MED ADMIN — IBUPROFEN 600 MG PO TAB [3844]: 600 mg | ORAL | @ 11:00:00 | NDC 00904585461

## 2020-11-18 MED ADMIN — IBUPROFEN 600 MG PO TAB [3844]: 600 mg | ORAL | @ 23:00:00 | NDC 67877032001

## 2020-11-18 MED ADMIN — ENOXAPARIN 40 MG/0.4 ML SC SYRG [85052]: 40 mg | SUBCUTANEOUS | @ 03:00:00 | NDC 00781324602

## 2020-11-18 MED ADMIN — SCOPOLAMINE BASE 1 MG OVER 3 DAYS TD PT3D [82141]: 1 | TRANSDERMAL | @ 17:00:00 | NDC 50742050501

## 2020-11-18 MED ADMIN — ONDANSETRON HCL (PF) 4 MG/2 ML IJ SOLN [136012]: 4 mg | INTRAVENOUS | @ 19:00:00 | Stop: 2020-11-18 | NDC 36000001225

## 2020-11-18 MED ADMIN — IBUPROFEN 600 MG PO TAB [3844]: 600 mg | ORAL | @ 17:00:00 | NDC 67877032001

## 2020-11-18 MED ADMIN — ACETAMINOPHEN 500 MG PO TAB [102]: 1000 mg | ORAL | @ 14:00:00 | NDC 00904673061

## 2020-11-18 MED ADMIN — DEXAMETHASONE SODIUM PHOSPHATE 10 MG/ML IJ SOLN [2331]: 8 mg | INTRAVENOUS | @ 03:00:00 | Stop: 2020-11-18 | NDC 00641036721

## 2020-11-19 ENCOUNTER — Encounter: Admit: 2020-11-19 | Discharge: 2020-11-19 | Payer: BC Managed Care – PPO

## 2020-11-19 MED ADMIN — IBUPROFEN 600 MG PO TAB [3844]: 600 mg | ORAL | @ 11:00:00 | Stop: 2020-11-20 | NDC 67877032001

## 2020-11-19 MED ADMIN — DIPHENHYDRAMINE HCL 50 MG/ML IJ SOLN [2508]: 25 mg | INTRAVENOUS | @ 02:00:00 | NDC 63323066400

## 2020-11-19 MED ADMIN — POLYETHYLENE GLYCOL 3350 17 GRAM PO PWPK [25424]: 17 g | ORAL | @ 15:00:00 | Stop: 2020-11-20 | NDC 00904693186

## 2020-11-19 MED ADMIN — PANTOPRAZOLE 40 MG IV SOLR [78621]: 40 mg | INTRAVENOUS | @ 15:00:00 | Stop: 2020-11-20 | NDC 55150020200

## 2020-11-19 MED ADMIN — POLYETHYLENE GLYCOL 3350 17 GRAM PO PWPK [25424]: 17 g | ORAL | @ 02:00:00 | NDC 00904693186

## 2020-11-19 MED ADMIN — ENOXAPARIN 40 MG/0.4 ML SC SYRG [85052]: 40 mg | SUBCUTANEOUS | @ 02:00:00 | NDC 00781324602

## 2020-11-19 MED ADMIN — IBUPROFEN 600 MG PO TAB [3844]: 600 mg | ORAL | @ 22:00:00 | Stop: 2020-11-20 | NDC 67877032001

## 2020-11-19 MED ADMIN — MAGNESIUM OXIDE 400 MG (241.3 MG MAGNESIUM) PO TAB [10491]: 200 mg | ORAL | @ 15:00:00 | Stop: 2020-11-20 | NDC 10006070028

## 2020-11-19 MED ADMIN — ACETAMINOPHEN 500 MG PO TAB [102]: 1000 mg | ORAL | @ 15:00:00 | Stop: 2020-11-20 | NDC 00904673061

## 2020-11-19 MED ADMIN — IBUPROFEN 600 MG PO TAB [3844]: 600 mg | ORAL | @ 17:00:00 | Stop: 2020-11-20 | NDC 67877032001

## 2020-11-19 MED ADMIN — IBUPROFEN 600 MG PO TAB [3844]: 600 mg | ORAL | @ 05:00:00 | Stop: 2020-11-20 | NDC 00904585461

## 2020-11-19 MED ADMIN — ACETAMINOPHEN 500 MG PO TAB [102]: 1000 mg | ORAL | @ 02:00:00 | NDC 00904673061

## 2020-11-20 ENCOUNTER — Encounter: Admit: 2020-11-20 | Discharge: 2020-11-20 | Payer: BC Managed Care – PPO

## 2020-11-20 DIAGNOSIS — G893 Neoplasm related pain (acute) (chronic): Secondary | ICD-10-CM

## 2020-11-20 MED ORDER — OXYCODONE 5 MG PO TAB
5 mg | ORAL_TABLET | ORAL | 0 refills | 6.00000 days | Status: AC | PRN
Start: 2020-11-20 — End: ?
  Filled 2020-11-20: 5d supply

## 2020-11-20 MED FILL — RIVAROXABAN 10 MG PO TAB: 10 mg | ORAL | 28 days supply | Status: CN

## 2020-11-20 MED FILL — IBUPROFEN 800 MG PO TAB: 800 mg | ORAL | 20 days supply | Status: CN

## 2020-11-20 NOTE — Telephone Encounter
Left Message on Dr. Martin Majestic nurse line regarding phone call from patient pharmacy, needing e-script for oxycodone script. Left pharmacy call back phone number.

## 2020-11-20 NOTE — Telephone Encounter
Received voicemail from Vicente Males, [place] in [place] that patient had several prescriptions sent to [facility] instead of her preferred pharmacy in [place]. [facility] was able to transfer all of the prescriptions except for the oxycodone. Cancelled oxycodone script that was sent to [facility] and resubmitted new order to be sent to [facility] in [place]. Returned call to [facility] to let them know this had been corrected, they confirmed all other prescriptions had been transferred and they do not need anything else at this time.

## 2020-11-23 ENCOUNTER — Encounter: Admit: 2020-11-23 | Discharge: 2020-11-23 | Payer: BC Managed Care – PPO

## 2020-11-23 DIAGNOSIS — C569 Malignant neoplasm of unspecified ovary: Secondary | ICD-10-CM

## 2020-12-01 ENCOUNTER — Encounter: Admit: 2020-12-01 | Discharge: 2020-12-01 | Payer: 59

## 2020-12-01 DIAGNOSIS — C569 Malignant neoplasm of unspecified ovary: Secondary | ICD-10-CM

## 2020-12-01 DIAGNOSIS — R000 Tachycardia, unspecified: Secondary | ICD-10-CM

## 2020-12-01 DIAGNOSIS — U071 COVID: Secondary | ICD-10-CM

## 2020-12-05 ENCOUNTER — Encounter: Admit: 2020-12-05 | Discharge: 2020-12-05 | Payer: 59

## 2020-12-05 MED ORDER — PACLITAXEL DESENSITIZATION IVPB (NON-DEHP/PVC)(SPEC VOL) BAG A
3.8 mg | Freq: Once | INTRAVENOUS | 0 refills
Start: 2020-12-05 — End: ?

## 2020-12-05 MED ORDER — DIPHENHYDRAMINE HCL 50 MG/ML IJ SOLN
50 mg | Freq: Once | INTRAVENOUS | 0 refills
Start: 2020-12-05 — End: ?

## 2020-12-05 MED ORDER — APREPITANT 7.2 MG/ML IV EMUL
130 mg | Freq: Once | INTRAVENOUS | 0 refills
Start: 2020-12-05 — End: ?

## 2020-12-05 MED ORDER — CARBOPLATIN DESENSITIZATION IVPB (SPEC VOL) BAG B
86 mg | Freq: Once | INTRAVENOUS | 0 refills
Start: 2020-12-05 — End: ?

## 2020-12-05 MED ORDER — PACLITAXEL DESENSITIZATION IVPB (NON-DEHP/PVC)(SPEC VOL) FINAL BAG
175 mg/m2 | Freq: Once | INTRAVENOUS | 0 refills
Start: 2020-12-05 — End: ?

## 2020-12-05 MED ORDER — PALONOSETRON 0.25 MG/5 ML IV SOLN
.25 mg | Freq: Once | INTRAVENOUS | 0 refills
Start: 2020-12-05 — End: ?

## 2020-12-05 MED ORDER — PACLITAXEL DESENSITIZATION IVPB (NON-DEHP/PVC)(SPEC VOL) BAG B
37.6 mg | Freq: Once | INTRAVENOUS | 0 refills
Start: 2020-12-05 — End: ?

## 2020-12-05 MED ORDER — DEXAMETHASONE IVPB
20 mg | Freq: Once | INTRAVENOUS | 0 refills
Start: 2020-12-05 — End: ?

## 2020-12-05 MED ORDER — CARBOPLATIN DESENSITIZATION IVPB (SPEC VOL)(AUC-GOG) FINAL BAG
900 mg | Freq: Once | INTRAVENOUS | 0 refills
Start: 2020-12-05 — End: ?

## 2020-12-05 MED ORDER — CARBOPLATIN DESENSITIZATION IVPB (SPEC VOL) BAG A
8.6 mg | Freq: Once | INTRAVENOUS | 0 refills
Start: 2020-12-05 — End: ?

## 2020-12-05 MED ORDER — FAMOTIDINE (PF) 20 MG/2 ML IV SOLN
20 mg | Freq: Once | INTRAVENOUS | 0 refills
Start: 2020-12-05 — End: ?

## 2020-12-05 MED ORDER — SODIUM CHLORIDE 0.9 % IV SOLP
500 mL | Freq: Once | INTRAVENOUS | 0 refills
Start: 2020-12-05 — End: ?

## 2020-12-06 ENCOUNTER — Encounter: Admit: 2020-12-06 | Discharge: 2020-12-06 | Payer: 59

## 2020-12-11 ENCOUNTER — Encounter: Admit: 2020-12-11 | Discharge: 2020-12-11 | Payer: 59

## 2020-12-11 DIAGNOSIS — Z5111 Encounter for antineoplastic chemotherapy: Secondary | ICD-10-CM

## 2020-12-11 DIAGNOSIS — C569 Malignant neoplasm of unspecified ovary: Secondary | ICD-10-CM

## 2020-12-11 DIAGNOSIS — R000 Tachycardia, unspecified: Secondary | ICD-10-CM

## 2020-12-11 DIAGNOSIS — U071 COVID: Secondary | ICD-10-CM

## 2020-12-11 MED ORDER — PACLITAXEL DESENSITIZATION IVPB (NON-DEHP/PVC)(SPEC VOL) BAG A
3.8 mg | Freq: Once | INTRAVENOUS | 0 refills | Status: CP
Start: 2020-12-11 — End: ?
  Administered 2020-12-11 (×2): 3.8 mg via INTRAVENOUS

## 2020-12-11 MED ORDER — FAMOTIDINE (PF) 20 MG/2 ML IV SOLN
20 mg | Freq: Once | INTRAVENOUS | 0 refills | Status: CP
Start: 2020-12-11 — End: ?
  Administered 2020-12-11: 15:00:00 20 mg via INTRAVENOUS

## 2020-12-11 MED ORDER — CARBOPLATIN DESENSITIZATION IVPB (SPEC VOL) BAG B
88 mg | Freq: Once | INTRAVENOUS | 0 refills
Start: 2020-12-11 — End: ?

## 2020-12-11 MED ORDER — CARBOPLATIN DESENSITIZATION IVPB (SPEC VOL) BAG A
8.8 mg | Freq: Once | INTRAVENOUS | 0 refills
Start: 2020-12-11 — End: ?

## 2020-12-11 MED ORDER — DEXAMETHASONE IVPB
20 mg | Freq: Once | INTRAVENOUS | 0 refills | Status: CP
Start: 2020-12-11 — End: ?
  Administered 2020-12-11 (×2): 20 mg via INTRAVENOUS

## 2020-12-11 MED ORDER — PACLITAXEL DESENSITIZATION IVPB (NON-DEHP/PVC)(SPEC VOL) FINAL BAG
175 mg/m2 | Freq: Once | INTRAVENOUS | 0 refills | Status: CP
Start: 2020-12-11 — End: ?
  Administered 2020-12-11 (×2): 376.248 mg via INTRAVENOUS

## 2020-12-11 MED ORDER — HEPARIN, PORCINE (PF) 100 UNIT/ML IV SYRG
500 [IU] | Freq: Once | 0 refills | Status: CP
Start: 2020-12-11 — End: ?

## 2020-12-11 MED ORDER — DIPHENHYDRAMINE HCL 50 MG/ML IJ SOLN
50 mg | Freq: Once | INTRAVENOUS | 0 refills | Status: CP
Start: 2020-12-11 — End: ?
  Administered 2020-12-11: 15:00:00 50 mg via INTRAVENOUS

## 2020-12-11 MED ORDER — PACLITAXEL DESENSITIZATION IVPB (NON-DEHP/PVC)(SPEC VOL) BAG B
37.6 mg | Freq: Once | INTRAVENOUS | 0 refills | Status: CP
Start: 2020-12-11 — End: ?
  Administered 2020-12-11 (×2): 37.6 mg via INTRAVENOUS

## 2020-12-11 NOTE — Progress Notes
Subjective     GYNECOLOGIC ONCOLOGY EVALUATION    Name:Kristina Velazquez    Date: 12/11/2020    Referring & Primary Care Physician: Jerene Dilling    Chief Complaint:   Chief Complaint   Patient presents with   ? Follow Up     History of Present Illness:  Kristina Velazquez is a 30 y.o. female with below history     Onc Timeline Overview Note   Kristina Velazquez is a 30 y.o. female patient of Dr. 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 Patient presents to clinic today for evaluation prior to C4 of Carbo/Taxol.     Patient reports she feels well overall. She is now walking 1/2 mile per day most days. Energy is ok, she is hydrating well and her appetite comes and goes depending on how her bowels are moving. Some days my bowels are angry, reports several soft/mucous like stools per day (baseline is 1-6 BMs in a day). Denies blood in her stool. Occasional stomach cramping leading up to a BM, relieved with having a BM. Occasional episode of nausea when her stomach cramps that resolves when her cramps resolve. She has intermittent hot flashes followed by cold chills since the initiation of therapy.     Denies fevers, chest pain, shortness of breath, cough, LE edema, abdominal distention, hematuria, urinary frequency/urgency/dysuria, vaginal bleeding/discharge and neuropathy.     She plans to travel to [place], [place] for Thanksgiving.     Ovarian cancer (HCC)   08/08/2020 Imaging    CT scan A/P demonstrated: Ill-defined complex pelvic mass with omental and mesenteric masses with intra-abdominal ascites suggesting a neoplastic process with metastatic disease. No other intraparenchymal metastases    CA125 = 862     08/16/2020 Biopsy    High grade serous carcinoma. ER 85%, PR 10%, positive PAX8, WT-1, p53     09/04/2020 -  Chemotherapy    Had reaction to both taxol and carbo in C1, plan for inpatient C2  OP GYN CARBOPLATIN (desensitization) + PACLITAXEL (desensitization) (OVARIAN)  Plan Provider: Eileen Stanford, MD  Treatment goal: Curative   Line of treatment: First Line     10/10/2020 Pertinent Labs    Caris:  Genomic LOH high  BRCA negative  MSI stable  CCNE1 not amplified  Her 2 neg    Pt would be candidate for PARP maintenance       10/25/2020 Imaging    CT CAP: no disease in chest. Decrease in size of the bilateral adnexal masses, peritoneal metastases, and malignant ascites suggesting favorable response to therapy by the patient's metastatic ovarian cancer.      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  Pregnancy Hx               Number of pregnancies: None               Number of live births:                Age of first live birth:                Did you breastfeed: No                            If Yes, how long?                Oral Birth Control:  Yes               Years: 8-9 years               Infertility Medication:  No               Year/Med Name:   ?  Menopausal Hx               Age of last period: N/A               Hormone Replacement Therapy:                 Years:   ?  Health Maintenence  Last Pap: 2021  Abn History of Pap: Denies  Colonoscopy: Denies  Mammogram: Denies  Bone scan: Denies  ?  Advanced Directives   DPOA: No  Living Will: No  ?  Past Medical History:  Medical History:   Diagnosis Date   ? COVID 01/29/2020   ? Inappropriate sinus tachycardia    ? Ovarian cancer [facility])      Past Surgical History:  Surgical History:   Procedure Laterality Date   ? HX SHOULDER SURGERY Right 2008   ? HX WISDOM TEETH EXTRACTION  2014   ? placement of port-a-cath with fluoroscopy Left 08/25/2020    Performed by Freund, Alecia Lemming., MD at [facility] OR   ? FLUOROSCOPIC GUIDANCE CENTRAL VENOUS ACCESS DEVICE PLACEMENT/ REPLACEMENT/ REMOVAL Left 08/25/2020    Performed by Freund, Alecia Lemming., MD at [facility] OR   ? ESOPHAGOGASTRODUODENOSCOPY WITH SPECIMEN COLLECTION BY BRUSHING/ WASHING N/A 11/13/2020    Performed by Comer Locket, MD at [facility] ENDO   ? COLONOSCOPY DIAGNOSTIC WITH SPECIMEN COLLECTION BY BRUSHING/ WASHING - FLEXIBLE N/A 11/13/2020    Performed by Comer Locket, MD at [name] ENDO   ? TOTAL ABDOMINAL HYSTERECTOMY AND BILATERAL SALPINGO-OOPHORECTOMY WITH OMENTECTOMY AND RADICAL RESECTION FOR DEBULKING, APPENDECTOMY, Bilateral 11/14/2020    Performed by Eileen Stanford, MD at [facility] OR   ? Cytoreductive surgery; largest tumor approx. 6 cm Hyperthermic Intraperitoneal chemotherapy  N/A 11/14/2020    Performed by Dia Crawford, MD at [facility] OR   ? HX CHOLECYSTECTOMY       Medications:    Current Outpatient Medications:   ?  acetaminophen (ACETAMINOPHEN EXTRA STRENGTH) 500 mg tablet, Take one tablet to two tablets by mouth every 6 hours as needed for Pain. Max of 4,000 mg of acetaminophen in 24 hours., Disp: 60 tablet, Rfl: 1  ?  ALPRAZolam (XANAX) 1 mg tablet, Take 1 mg by mouth daily as needed for Anxiety., Disp: , Rfl:   ?  amphetamine-dextroamphetamine XR (ADDERALL XR) 30 mg  capsule, Take 30 mg by mouth twice daily., Disp: , Rfl:   ?  dexAMETHasone (DECADRON) 4 mg tablet, Take two tablets by mouth daily. On Days 2-4 of each cycle., Disp: 36 tablet, Rfl: 0  ?  diphenhydrAMINE hcl (BENADRYL ALLERGY) 25 mg tablet, Take 25-50 mg by mouth at bedtime as needed., Disp: , Rfl:   ?  ibuprofen (MOTRIN) 800 mg tablet, Take one tablet by mouth every 8 hours as needed. Take with food., Disp: 60 tablet, Rfl: 1  ?  lidocaine/prilocaine (EMLA) 2.5/2.5 % topical cream, Apply  topically to affected area as Needed. Apply 30-60 minutes prior to port access., Disp: 30 g, Rfl: 0  ?  melatonin 5 mg chew, Chew 10 mg by mouth at bedtime daily., Disp: , Rfl:   ?  MULTIVITAMIN PO, Take 1 tablet by mouth daily., Disp: , Rfl:   ?  ondansetron (ZOFRAN ODT) 4 mg rapid dissolve tablet, Dissolve one tablet by mouth every 8 hours as needed for Nausea or Vomiting. Place on tongue to dissolve., Disp: 30 tablet, Rfl: 1  ?  ondansetron HCL (ZOFRAN) 8 mg tablet, Take one tablet by mouth every 8 hours as needed (nausea and vomiting)., Disp: 30 tablet, Rfl: 3  ?  oxyCODONE (ROXICODONE) 5 mg tablet, Take one tablet by mouth every 6 hours as needed for Pain., Disp: 30 tablet, Rfl: 0  ?  pantoprazole DR (PROTONIX) 20 mg tablet, Take 40 mg by mouth at bedtime daily., Disp: , Rfl:   ?  polyethylene glycol 3350 (MIRALAX) 17 gram/dose powder, Take seventeen g by mouth daily., Disp: 510 g, Rfl: 1  ?  prochlorperazine maleate (COMPAZINE) 10 mg tablet, Take one tablet by mouth every 6 hours as needed for Nausea or Vomiting., Disp: 30 tablet, Rfl: 3  ?  rivaroxaban (XARELTO) 10 mg tablet, Take one tablet by mouth daily for 28 days., Disp: 28 tablet, Rfl: 0  ?  senna/docusate (SENOKOT-S) 8.6/50 mg tablet, Take one tablet by mouth daily., Disp: 60 tablet, Rfl: 1  ?  traMADoL (ULTRAM) 50 mg tablet, Take 50 mg by mouth every 8 hours as needed for Pain., Disp: , Rfl:   No current facility-administered medications for this visit.    Facility-Administered Medications Ordered in Other Visits:   ?  dexamethasone sodium phosphate (DECADRON) 20 mg in sodium chloride 0.9% (NS) 50 mL IVPB, 20 mg, Intravenous, ONCE, Eileen Stanford, MD  ?  diphenhydrAMINE HCL (BENADRYL) injection 50 mg, 50 mg, Intravenous, ONCE, Eileen Stanford, MD  ?  famotidine (PEPCID) injection 20 mg, 20 mg, Intravenous, ONCE, Eileen Stanford, MD  ?  heparin lock flush PF syringe 500 Units, 500 Units, Flush, ONCE, Davisha Linthicum, Ernest Haber, APRN-NP  ?  PACLitaxeL (TAXOL) 3.8 mg in sodium chloride 0.9% (NS) 250 mL IVPB (non-DEHP/PVC)(Desensitization Bag A), 3.8 mg, Intravenous, ONCE, Eileen Stanford, MD  ?  PACLitaxeL (TAXOL) 37.6 mg in sodium chloride 0.9% (NS) 250 mL IVPB (non-DEHP/PVC)(Desensitization Bag B), 37.6 mg, Intravenous, ONCE, Eileen Stanford, MD  ?  PACLitaxeL (TAXOL) 376.248 mg in sodium chloride 0.9% (NS) 500 mL IVPB (non-DEHP/PVC)(Desensitization Final Bag), 175 mg/m2 (Treatment Plan Recorded), Intravenous, ONCE, Eileen Stanford, MD    Allergies:  Allergies   Allergen Reactions   ? Hydrocodone STOMACH UPSET and VOMITING   ? Paclitaxel CHEST TIGHTNESS, VISION CHANGES and REDNESS   ? Peanut RASH   ? Carboplatin FLUSHING (SKIN), HEADACHE and REDNESS     Social History:  Social History  Socioeconomic History   ? Marital status: Married   Tobacco Use   ? Smoking status: Never Smoker   ? Smokeless tobacco: Never Used   Vaping Use   ? Vaping Use: Never used   Substance and Sexual Activity   ? Alcohol use: Yes     Comment: OCASSIONALLY    ? Drug use: Never     Family History:  Family History   Problem Relation Age of Onset   ? Genetic Disorder Mother         BRCA1+   ? Cancer-Breast Mother 25   ? Cancer-Ovarian Mother 26   ? Heart Disease Mother    ? Depression Mother    ? Kidney Cancer Father 95   ? Hypertension Father    ? None Reported Sister    ? None Reported Brother    ? Unknown to Patient Paternal Uncle    ? Hypertension Maternal Grandmother    ? Arthritis-osteo Maternal Grandmother    ? Migraines Maternal Grandmother    ? Depression Maternal Grandmother    ? Heart Disease Maternal Grandfather    ? Hypertension Maternal Grandfather    ? Cancer-Breast Paternal Grandmother 31   ? Hypertension Paternal Grandfather    ? Stroke Paternal Grandfather      REVIEW OF SYSTEMS:        CONSTITUTIONAL: Negative unless stated in HPI  EYES: Negative unless stated in HPI  ENT: Negative unless stated in HPI  RESPIRATORY: Negative unless stated in HPI  CARDIOVASCULAR: Negative unless stated in HPI  GI: Negative unless stated in HPI  GU: Negative unless stated in HPI  MUSCULO-SKELETAL: Negative unless stated in HPI  SKIN: Negative unless stated in HPI  ENDOCRINE: Negative unless stated in HPI  HEMATOLOGIC: Negative unless stated in HPI    ECOG 1    Physical Exam:    BP 118/89 (BP Source: Arm, Left Upper, Patient Position: Sitting)  - Pulse (!) 122  - Temp 36.1 ?C (97 ?F) (Temporal)  - Wt 90.4 kg (199 lb 3.2 oz)  - LMP 08/17/2020  - SpO2 100%  - BMI 31.20 kg/m?   GENERAL APPEARANCE: Appears healthy.  Alert; in no acute distress.  Pleasant   HEENT: Alopecia, wearing head wrap. Otherwise, unremarkable. No tenderness or masses noted.  NECK: Neck supple. No tenderness. No adenopathy.    LUNGS: Chest symmetrical. Good diaphragmatic excursion. Lungs clear; normal breath sounds.  CARDIOVASCULAR:  RRR. Heart sounds normal.      ABDOMEN: Soft, non-tender and non-distended. No obvious masses, organomegaly or hernia. Incision well approximated and healing appropriately.    EXTREMITIES: Extremities normal. No joint deformities, edema, or skin discoloration. Station and gait normal.   SKIN: Skin color, texture, turgor normal. No rashes or lesions.    CBC w/Diff    Lab Results   Component Value Date/Time    WBC 7.6 12/11/2020 07:48 AM    RBC 4.08 12/11/2020 07:48 AM    HGB 12.1 12/11/2020 07:48 AM    HCT 35.0 (L) 12/11/2020 07:48 AM    MCV 85.9 12/11/2020 07:48 AM    MCH 29.6 12/11/2020 07:48 AM    MCHC 34.4 12/11/2020 07:48 AM    RDW 18.6 (H) 12/11/2020 07:48 AM    PLTCT 361 12/11/2020 07:48 AM    MPV 6.7 (L) 12/11/2020 07:48 AM    Lab Results   Component Value Date/Time    NEUT 53 12/11/2020 07:48 AM    ANC 4.10 12/11/2020 07:48 AM  LYMA 28 12/11/2020 07:48 AM    ALC 2.10 12/11/2020 07:48 AM    MONA 9 12/11/2020 07:48 AM    AMC 0.70 12/11/2020 07:48 AM    EOSA 9 (H) 12/11/2020 07:48 AM    AEC 0.70 (H) 12/11/2020 07:48 AM    BASA 1 12/11/2020 07:48 AM    ABC 0.00 12/11/2020 07:48 AM          Comprehensive Metabolic Profile    Lab Results   Component Value Date/Time    NA 138 12/11/2020 07:48 AM    K 3.6 12/11/2020 07:48 AM    CL 103 12/11/2020 07:48 AM    CO2 25 12/11/2020 07:48 AM    GAP 10 12/11/2020 07:48 AM    BUN 8 12/11/2020 07:48 AM    CR 0.83 12/11/2020 07:48 AM    GLU 159 (H) 12/11/2020 07:48 AM    Lab Results   Component Value Date/Time    CA 9.5 12/11/2020 07:48 AM    PO4 3.7 11/17/2020 04:09 AM    ALBUMIN 4.1 12/11/2020 07:48 AM    TOTPROT 7.2 12/11/2020 07:48 AM    ALKPHOS 63 12/11/2020 07:48 AM    AST 15 12/11/2020 07:48 AM    ALT 23 12/11/2020 07:48 AM    TOTBILI 0.5 12/11/2020 07:48 AM          Other labs    No results found for: ESR No results found for: LDH     Lab Results   Component Value Date    CA125 913 (H) 10/16/2020          ASSESSMENT/PLAN:  Kristina Velazquez is a 30 y.o. 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 Patient presents to clinic today for evaluation prior to C4 of Carbo/Taxol.   ? Feeling well overall postoperatively.   ? Tachycardia: Basline per patient. Encourage adequate hydration with goal of minimum 80 oz non caffeine containing beverages per day.   ? Confirmed with patient that she has anti-emetics and bowel regimen medications on hand at home for PRN use.   ? Otherwise, CBC, CMP, vitals and physical exam are appropriate to proceed with treatment today.   ? We will begin maintenance chemotherapy with olaparib therapy 2 to 4 weeks after she completes total of 3 cycles of adjuvant chemotherapy.   ? Patient verbalizes understanding of these instructions, denies further questions at this time.   ? All lab, provider, treatment and imaging appointments have been arranged through January 2023.     Ernest Haber Avianna Moynahan, APRN-NP       Total time spent date of service: 30 minutes, which was spent in preparation for patient visit, review of pertinent history, labs and imaging, coordination of patient care, education and counseling of patient. Discussed the patient's diagnosis, management, and reviewed goals.

## 2020-12-12 ENCOUNTER — Encounter: Admit: 2020-12-12 | Discharge: 2020-12-12 | Payer: 59

## 2020-12-12 DIAGNOSIS — C569 Malignant neoplasm of unspecified ovary: Secondary | ICD-10-CM

## 2020-12-12 MED ORDER — POTASSIUM CHLORIDE IN 0.9%NACL 20 MEQ/L IV SOLP
1000 mL | Freq: Once | INTRAVENOUS | 0 refills | Status: CP
Start: 2020-12-12 — End: ?
  Administered 2020-12-12: 17:00:00 1000 mL via INTRAVENOUS

## 2020-12-12 MED ORDER — APREPITANT 7.2 MG/ML IV EMUL
130 mg | Freq: Once | INTRAVENOUS | 0 refills | Status: CP
Start: 2020-12-12 — End: ?
  Administered 2020-12-12: 14:00:00 130 mg via INTRAVENOUS

## 2020-12-12 MED ORDER — CARBOPLATIN DESENSITIZATION IVPB (SPEC VOL) BAG B
88 mg | Freq: Once | INTRAVENOUS | 0 refills | Status: CP
Start: 2020-12-12 — End: ?
  Administered 2020-12-12 (×2): 88 mg via INTRAVENOUS

## 2020-12-12 MED ORDER — CARBOPLATIN DESENSITIZATION IVPB (SPEC VOL)(AUC-GOG) FINAL BAG
881.4 mg | Freq: Once | INTRAVENOUS | 0 refills | Status: CP
Start: 2020-12-12 — End: ?
  Administered 2020-12-12 (×2): 881.4 mg via INTRAVENOUS

## 2020-12-12 MED ORDER — SODIUM CHLORIDE 0.9 % IV SOLP
500 mL | Freq: Once | INTRAVENOUS | 0 refills | Status: CP
Start: 2020-12-12 — End: ?
  Administered 2020-12-12: 13:00:00 500 mL via INTRAVENOUS

## 2020-12-12 MED ORDER — PALONOSETRON 0.25 MG/5 ML IV SOLN
.25 mg | Freq: Once | INTRAVENOUS | 0 refills | Status: CP
Start: 2020-12-12 — End: ?
  Administered 2020-12-12: 14:00:00 0.25 mg via INTRAVENOUS

## 2020-12-12 MED ORDER — DEXAMETHASONE IVPB
20 mg | Freq: Once | INTRAVENOUS | 0 refills | Status: CP
Start: 2020-12-12 — End: ?
  Administered 2020-12-12 (×2): 20 mg via INTRAVENOUS

## 2020-12-12 MED ORDER — DIPHENHYDRAMINE HCL 50 MG/ML IJ SOLN
50 mg | Freq: Once | INTRAVENOUS | 0 refills | Status: CP
Start: 2020-12-12 — End: ?
  Administered 2020-12-12: 14:00:00 50 mg via INTRAVENOUS

## 2020-12-12 MED ORDER — FAMOTIDINE (PF) 20 MG/2 ML IV SOLN
20 mg | Freq: Once | INTRAVENOUS | 0 refills | Status: CP
Start: 2020-12-12 — End: ?
  Administered 2020-12-12: 14:00:00 20 mg via INTRAVENOUS

## 2020-12-12 MED ORDER — MAGNESIUM SULFATE IN WATER 2 GRAM/50 ML (4 %) IV PGBK
2 g | Freq: Once | INTRAVENOUS | 0 refills | Status: CP
Start: 2020-12-12 — End: ?
  Administered 2020-12-12: 17:00:00 2 g via INTRAVENOUS

## 2020-12-12 MED ORDER — CARBOPLATIN DESENSITIZATION IVPB (SPEC VOL) BAG A
8.8 mg | Freq: Once | INTRAVENOUS | 0 refills | Status: CP
Start: 2020-12-12 — End: ?
  Administered 2020-12-12 (×2): 8.8 mg via INTRAVENOUS

## 2020-12-12 MED ORDER — HEPARIN, PORCINE (PF) 100 UNIT/ML IV SYRG
500 [IU] | Freq: Once | 0 refills | Status: CP
Start: 2020-12-12 — End: ?

## 2020-12-12 NOTE — Progress Notes
CHEMO NOTE  Verified chemo consent signed and in chart.    Blood return positive via: Port (Single and Accessed)    BSA and dose double checked (agree with orders as written) with: yes     Labs/applicable tests checked: CBC and Comprehensive Metabolic Panel (CMP)    Chemo regimen: Drug/cycle/day C4D2    CARBOplatin (PARAPLATIN) 8.8 mg in sodium chloride 0.9% (NS) 250 mL IVPB (Desensitization Bag A)       CARBOplatin (PARAPLATIN) 88 mg in sodium chloride 0.9% (NS) 250 mL IVPB (Desensitization Bag B)     CARBOplatin (PARAPLATIN) 881.4 mg in sodium chloride 0.9% (NS) 250 mL IVPB (AUC-GOG)(Desensitization final bag)     Rate verified and armband double check with second RN: yes    Patient education offered and stated understanding. Denies questions at this time.      Patient presents to [place] treatment for C4D2 Carboplatin Desense. VSS. No new concerns voiced. Port flushed and positive for blood return. Pre-hydration and premeds given per treatment plan. Carboplatin Bags A, B, and C and post-hydration given without incident. Port flushed and de-accessed per protocol. Patient denies further questions or concerns. Copy of AVS and labs declined. Patient left CC treatment ambulatory in stable condition.

## 2020-12-29 NOTE — Progress Notes
Subjective     GYNECOLOGIC ONCOLOGY EVALUATION    Name:Kristina Velazquez    Date: 01/01/2021    Referring & Primary Care Physician: Jerene Dilling    Chief Complaint:   Chief Complaint   Patient presents with   ? Follow Up     History of Present Illness:  Kristina Velazquez is a 30 y.o. female with below history     Onc Timeline Overview Note   Kristina Velazquez is a 30 y.o. female patient of Dr. 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 Patient presents to clinic today for evaluation prior to C5 of Carbo/Taxol (both via desensitization).      Patient presents today with the following complaints. Nausea, vomiting, stomach cramps and mucous like bowel movements that started the day after Carbo (D3) and lasted ~ 1 week post treatment. Fatigue, reports this was at it's worst the first week after chemo along with the GI symptoms, reports she spent her days in bed when she was feeling poorly. Fatigue now improved, ranks at a 4/10 in intensity today.     She denies chest pain, SOA, cough, abdominal bloating, hematuria, hematochezia, vaginal bleeding/discharge, urinary frequency/urgency/dysuria, bone pain, LE edema, fevers/chills and peripheral neuropathy.     Ovarian cancer (HCC)   08/08/2020 Imaging    CT scan A/P demonstrated: Ill-defined complex pelvic mass with omental and mesenteric masses with intra-abdominal ascites suggesting a neoplastic process with metastatic disease. No other intraparenchymal metastases    CA125 = 862     08/16/2020 Biopsy    High grade serous carcinoma. ER 85%, PR 10%, positive PAX8, WT-1, p53     09/04/2020 -  Chemotherapy    Had reaction to both taxol and carbo in C1, plan for inpatient C2  OP GYN CARBOPLATIN (desensitization) + PACLITAXEL (desensitization) (OVARIAN)  Plan Provider: Eileen Stanford, MD  Treatment goal: Curative   Line of treatment: First Line     10/10/2020 Pertinent Labs Caris:  Genomic LOH high  BRCA negative  MSI stable  CCNE1 not amplified  Her 2 neg    Pt would be candidate for PARP maintenance       10/25/2020 Imaging    CT CAP: no disease in chest. Decrease in size of the bilateral adnexal masses, peritoneal metastases, and malignant ascites suggesting favorable response to therapy by the patient's metastatic ovarian cancer.      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  Pregnancy Hx               Number of pregnancies: None  Number of live births:                Age of first live birth:                Did you breastfeed: No                            If Yes, how long?                Oral Birth Control:  Yes               Years: 8-9 years               Infertility Medication:  No               Year/Med Name:   ?  Menopausal Hx               Age of last period: N/A               Hormone Replacement Therapy:                 Years:   ?  Health Maintenence  Last Pap: 2021  Abn History of Pap: Denies  Colonoscopy: Denies  Mammogram: Denies  Bone scan: Denies  ?  Advanced Directives   DPOA: No  Living Will: No  ?  Past Medical History:  Medical History:   Diagnosis Date   ? COVID 01/29/2020   ? Inappropriate sinus tachycardia    ? Ovarian cancer [facility])      Past Surgical History:  Surgical History:   Procedure Laterality Date   ? HX SHOULDER SURGERY Right 2008   ? HX WISDOM TEETH EXTRACTION  2014   ? placement of port-a-cath with fluoroscopy Left 08/25/2020    Performed by Freund, Alecia Lemming., MD at [facility] OR   ? FLUOROSCOPIC GUIDANCE CENTRAL VENOUS ACCESS DEVICE PLACEMENT/ REPLACEMENT/ REMOVAL Left 08/25/2020    Performed by Freund, Alecia Lemming., MD at [facility] OR   ? ESOPHAGOGASTRODUODENOSCOPY WITH SPECIMEN COLLECTION BY BRUSHING/ WASHING N/A 11/13/2020    Performed by Comer Locket, MD at [facility] ENDO   ? COLONOSCOPY DIAGNOSTIC WITH SPECIMEN COLLECTION BY BRUSHING/ WASHING - FLEXIBLE N/A 11/13/2020    Performed by Comer Locket, MD at [facility] ENDO   ? TOTAL ABDOMINAL HYSTERECTOMY AND BILATERAL SALPINGO-OOPHORECTOMY WITH OMENTECTOMY AND RADICAL RESECTION FOR DEBULKING, APPENDECTOMY, Bilateral 11/14/2020    Performed by Eileen Stanford, MD at [facility] OR   ? Cytoreductive surgery; largest tumor approx. 6 cm Hyperthermic Intraperitoneal chemotherapy  N/A 11/14/2020    Performed by Dia Crawford, MD at [facility] OR   ? HX CHOLECYSTECTOMY       Medications:    Current Outpatient Medications:   ?  acetaminophen (ACETAMINOPHEN EXTRA STRENGTH) 500 mg tablet, Take one tablet to two tablets by mouth every 6 hours as needed for Pain. Max of 4,000 mg of acetaminophen in 24 hours., Disp: 60 tablet, Rfl: 1  ?  ALPRAZolam (XANAX) 1 mg tablet, Take 1 mg by mouth daily as needed for Anxiety., Disp: , Rfl:   ?  amphetamine-dextroamphetamine XR (ADDERALL XR) 30 mg capsule, Take 30 mg by mouth twice daily., Disp: , Rfl:   ?  dexAMETHasone (DECADRON) 4 mg tablet, Take two tablets by mouth daily. On Days 2-4 of each cycle., Disp: 36 tablet, Rfl: 0  ?  diphenhydrAMINE hcl (  BENADRYL ALLERGY) 25 mg tablet, Take 25-50 mg by mouth at bedtime as needed., Disp: , Rfl:   ?  ibuprofen (MOTRIN) 800 mg tablet, Take one tablet by mouth every 8 hours as needed. Take with food., Disp: 60 tablet, Rfl: 1  ?  lidocaine/prilocaine (EMLA) 2.5/2.5 % topical cream, Apply  topically to affected area as Needed. Apply 30-60 minutes prior to port access., Disp: 30 g, Rfl: 0  ?  melatonin 5 mg chew, Chew 10 mg by mouth at bedtime daily., Disp: , Rfl:   ?  MULTIVITAMIN PO, Take 1 tablet by mouth daily., Disp: , Rfl:   ?  OLANZapine (ZYPREXA) 5 mg tablet, Take one tab by mouth at bedtime daily on days 2-5 post chemotherapy.  Indications: nausea and vomiting caused by cancer drugs, Disp: 10 tablet, Rfl: 0  ?  ondansetron (ZOFRAN ODT) 4 mg rapid dissolve tablet, Dissolve one tablet by mouth every 8 hours as needed for Nausea or Vomiting. Place on tongue to dissolve., Disp: 30 tablet, Rfl: 1  ?  ondansetron HCL (ZOFRAN) 8 mg tablet, Take one tablet by mouth every 8 hours as needed (nausea and vomiting)., Disp: 30 tablet, Rfl: 3  ?  oxyCODONE (ROXICODONE) 5 mg tablet, Take one tablet by mouth every 6 hours as needed for Pain., Disp: 30 tablet, Rfl: 0  ?  pantoprazole DR (PROTONIX) 20 mg tablet, Take 40 mg by mouth at bedtime daily., Disp: , Rfl:   ?  polyethylene glycol 3350 (MIRALAX) 17 gram/dose powder, Take seventeen g by mouth daily., Disp: 510 g, Rfl: 1  ?  prochlorperazine maleate (COMPAZINE) 10 mg tablet, Take one tablet by mouth every 6 hours as needed for Nausea or Vomiting., Disp: 30 tablet, Rfl: 3  ?  senna/docusate (SENOKOT-S) 8.6/50 mg tablet, Take one tablet by mouth daily., Disp: 60 tablet, Rfl: 1  ?  traMADoL (ULTRAM) 50 mg tablet, Take 50 mg by mouth every 8 hours as needed for Pain., Disp: , Rfl:     Allergies:  Allergies   Allergen Reactions   ? Hydrocodone STOMACH UPSET and VOMITING   ? Paclitaxel CHEST TIGHTNESS, VISION CHANGES and REDNESS   ? Peanut RASH   ? Carboplatin FLUSHING (SKIN), HEADACHE and REDNESS     Social History:  Social History     Socioeconomic History   ? Marital status: Married   Tobacco Use   ? Smoking status: Never   ? Smokeless tobacco: Never   Vaping Use   ? Vaping Use: Never used   Substance and Sexual Activity   ? Alcohol use: Yes     Comment: OCASSIONALLY    ? Drug use: Never Family History:  Family History   Problem Relation Age of Onset   ? Genetic Disorder Mother         BRCA1+   ? Cancer-Breast Mother 91   ? Cancer-Ovarian Mother 74   ? Heart Disease Mother    ? Depression Mother    ? Kidney Cancer Father 59   ? Hypertension Father    ? None Reported Sister    ? None Reported Brother    ? Unknown to Patient Paternal Uncle    ? Hypertension Maternal Grandmother    ? Arthritis-osteo Maternal Grandmother    ? Migraines Maternal Grandmother    ? Depression Maternal Grandmother    ? Heart Disease Maternal Grandfather    ? Hypertension Maternal Grandfather    ? Cancer-Breast Paternal Grandmother 12   ? Hypertension Paternal  Grandfather    ? Stroke Paternal Grandfather      REVIEW OF SYSTEMS:        CONSTITUTIONAL: Negative unless stated in HPI  EYES: Negative unless stated in HPI  ENT: Negative unless stated in HPI  RESPIRATORY: Negative unless stated in HPI  CARDIOVASCULAR: Negative unless stated in HPI  GI: Negative unless stated in HPI  GU: Negative unless stated in HPI  MUSCULO-SKELETAL: Negative unless stated in HPI  SKIN: Negative unless stated in HPI  ENDOCRINE: Negative unless stated in HPI  HEMATOLOGIC: Negative unless stated in HPI    ECOG 1    Physical Exam:    BP (!) 128/96 (BP Source: Arm, Right Upper, Patient Position: Sitting)  - Pulse 110  - Temp (!) 35.8 ?C (96.5 ?F) (Temporal)  - Wt 90.7 kg (200 lb)  - LMP 08/17/2020  - SpO2 99%  - BMI 31.32 kg/m?   GENERAL APPEARANCE: Appears healthy.  Alert; in no acute distress.  Pleasant   HEENT: Alopecia, wearing head wrap. Otherwise, unremarkable. No tenderness or masses noted.  NECK: Neck supple. No tenderness. No adenopathy.    LUNGS: Chest symmetrical. Good diaphragmatic excursion. Lungs clear; normal breath sounds.  CARDIOVASCULAR: RRR. Heart sounds normal.      ABDOMEN: Soft, non-tender and non-distended. No obvious masses, organomegaly or hernia. Incision well approximated and healing appropriately.    EXTREMITIES: Extremities normal. No joint deformities, edema, or skin discoloration. Station and gait normal.   SKIN: Skin color, texture, turgor normal. No rashes or lesions.  LYMPH: No palpable supraclavicular or cervical lymph nodes.     CBC w/Diff    Lab Results   Component Value Date/Time    WBC 6.9 01/01/2021 07:36 AM    RBC 4.30 01/01/2021 07:36 AM    HGB 12.6 01/01/2021 07:36 AM    HCT 37.0 01/01/2021 07:36 AM    MCV 86.1 01/01/2021 07:36 AM    MCH 29.3 01/01/2021 07:36 AM    MCHC 34.0 01/01/2021 07:36 AM    RDW 18.1 (H) 01/01/2021 07:36 AM    PLTCT 262 01/01/2021 07:36 AM    MPV 6.3 (L) 01/01/2021 07:36 AM    Lab Results   Component Value Date/Time    NEUT 64 01/01/2021 07:36 AM    ANC 4.50 01/01/2021 07:36 AM    LYMA 25 01/01/2021 07:36 AM    ALC 1.70 01/01/2021 07:36 AM    MONA 9 01/01/2021 07:36 AM    AMC 0.60 01/01/2021 07:36 AM    EOSA 1 01/01/2021 07:36 AM    AEC 0.10 01/01/2021 07:36 AM    BASA 1 01/01/2021 07:36 AM    ABC 0.00 01/01/2021 07:36 AM          Comprehensive Metabolic Profile    Lab Results   Component Value Date/Time    NA 137 01/01/2021 07:36 AM    K 4.2 01/01/2021 07:36 AM    CL 104 01/01/2021 07:36 AM    CO2 25 01/01/2021 07:36 AM    GAP 8 01/01/2021 07:36 AM    BUN 11 01/01/2021 07:36 AM    CR 0.68 01/01/2021 07:36 AM    GLU 97 01/01/2021 07:36 AM    Lab Results   Component Value Date/Time    CA 9.4 01/01/2021 07:36 AM    PO4 3.7 11/17/2020 04:09 AM    ALBUMIN 4.3 01/01/2021 07:36 AM    TOTPROT 7.4 01/01/2021 07:36 AM    ALKPHOS 62 01/01/2021 07:36 AM    AST  15 01/01/2021 07:36 AM    ALT 24 01/01/2021 07:36 AM    TOTBILI 0.4 01/01/2021 07:36 AM          Other labs    No results found for: ESR No results found for: LDH     Lab Results   Component Value Date    CA125 24 01/01/2021         ASSESSMENT/PLAN:  Kristina Velazquez is a 30 y.o. 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 Patient presents to clinic today for evaluation prior to C5 of Carbo/Taxol (both via desensitization).    ? CINV: Uncontrolled x 1 week s/p C4 despite dexamethasone, compazine and zofran. Adding in Zyprexa 5 mg QHS D2-5 post chemo with the next two cycles. Rx sent to [facility] for her to pick up today.   ? Fatigue: Rest when > 6/10 in intensity. Increase PO fluids intake (goal 80 oz per day), increase PO protein intake (goal 60 - 80 grams per day), and engage in light daily exercise to promote stamina.  ? Tachycardia: Basline per patient. Encourage adequate hydration with goal of minimum 80 oz non caffeine containing beverages per day.   ? CA125 has normalized at 24.   ? Otherwise, CBC, CMP, vitals and physical exam are appropriate to proceed with treatment today.   ? We will begin maintenance chemotherapy with olaparib therapy 2 to 4 weeks after she completes total of 3 cycles of adjuvant chemotherapy.   ? Patient verbalizes understanding of these instructions, denies further questions at this time.   ? All lab, provider, treatment and imaging appointments have been arranged through January 2023.     Ernest Haber Sergei Delo, APRN-NP       Total time spent date of service: 30 minutes, which was spent in preparation for patient visit, review of pertinent history, labs and imaging, coordination of patient care, education and counseling of patient. Discussed the patient's diagnosis, management, and reviewed goals.

## 2021-01-01 ENCOUNTER — Encounter: Admit: 2021-01-01 | Discharge: 2021-01-01 | Payer: 59

## 2021-01-01 DIAGNOSIS — C569 Malignant neoplasm of unspecified ovary: Secondary | ICD-10-CM

## 2021-01-01 DIAGNOSIS — Z5111 Encounter for antineoplastic chemotherapy: Secondary | ICD-10-CM

## 2021-01-01 DIAGNOSIS — U071 COVID: Secondary | ICD-10-CM

## 2021-01-01 DIAGNOSIS — R530 Neoplastic (malignant) related fatigue: Secondary | ICD-10-CM

## 2021-01-01 DIAGNOSIS — R000 Tachycardia, unspecified: Secondary | ICD-10-CM

## 2021-01-01 DIAGNOSIS — R112 Nausea with vomiting, unspecified: Secondary | ICD-10-CM

## 2021-01-01 MED ORDER — PACLITAXEL DESENSITIZATION IVPB (NON-DEHP/PVC)(SPEC VOL) BAG A
3.8 mg | Freq: Once | INTRAVENOUS | 0 refills | Status: CP
Start: 2021-01-01 — End: ?
  Administered 2021-01-01 (×2): 3.8 mg via INTRAVENOUS

## 2021-01-01 MED ORDER — PACLITAXEL DESENSITIZATION IVPB (NON-DEHP/PVC)(SPEC VOL) FINAL BAG
175 mg/m2 | Freq: Once | INTRAVENOUS | 0 refills | Status: CP
Start: 2021-01-01 — End: ?
  Administered 2021-01-01 (×3): 376.248 mg via INTRAVENOUS

## 2021-01-01 MED ORDER — PACLITAXEL DESENSITIZATION IVPB (NON-DEHP/PVC)(SPEC VOL) BAG B
37.6 mg | Freq: Once | INTRAVENOUS | 0 refills | Status: CP
Start: 2021-01-01 — End: ?
  Administered 2021-01-01 (×2): 37.6 mg via INTRAVENOUS

## 2021-01-01 MED ORDER — DEXAMETHASONE IVPB
20 mg | Freq: Once | INTRAVENOUS | 0 refills | Status: CP
Start: 2021-01-01 — End: ?
  Administered 2021-01-01 (×2): 20 mg via INTRAVENOUS

## 2021-01-01 MED ORDER — DIPHENHYDRAMINE HCL 50 MG/ML IJ SOLN
50 mg | Freq: Once | INTRAVENOUS | 0 refills | Status: CP
Start: 2021-01-01 — End: ?
  Administered 2021-01-01: 15:00:00 50 mg via INTRAVENOUS

## 2021-01-01 MED ORDER — CARBOPLATIN DESENSITIZATION IVPB (SPEC VOL) BAG A
9 mg | Freq: Once | INTRAVENOUS | 0 refills
Start: 2021-01-01 — End: ?

## 2021-01-01 MED ORDER — OLANZAPINE 5 MG PO TAB
ORAL_TABLET | Freq: Every evening | 0 refills | Status: CN
Start: 2021-01-01 — End: ?

## 2021-01-01 MED ORDER — FAMOTIDINE (PF) 20 MG/2 ML IV SOLN
20 mg | Freq: Once | INTRAVENOUS | 0 refills | Status: CP
Start: 2021-01-01 — End: ?
  Administered 2021-01-01: 15:00:00 20 mg via INTRAVENOUS

## 2021-01-01 MED ORDER — CARBOPLATIN DESENSITIZATION IVPB (SPEC VOL) BAG B
90 mg | Freq: Once | INTRAVENOUS | 0 refills
Start: 2021-01-01 — End: ?

## 2021-01-01 MED FILL — OLANZAPINE 5 MG PO TAB: 5 mg | 10 days supply | Status: CN

## 2021-01-01 NOTE — Telephone Encounter
Faxed letter to [facility] per patient's request to extend leave until 02/05/21. Received confirmation.

## 2021-01-01 NOTE — Progress Notes
CHEMO NOTE  Verified chemo consent signed and in chart.    Blood return positive via: Port     BSA and dose double checked (agree with orders as written) with: yes, see MAR    Labs/applicable tests checked: CBC and Comprehensive Metabolic Panel (CMP)    Chemo regimen: Drug/cycle/day C5D1     PACLitaxeL (TAXOL) 3.8 mg in sodium chloride 0.9% (NS) 250 mL IVPB    PACLitaxeL (TAXOL) 37.6 mg in sodium chloride 0.9% (NS) 250 mL IVPB (non-DEHP/PVC)(    PACLitaxeL (TAXOL) 376.248 mg in sodium chloride 0.9% (NS) 500 mL IVPB     Rate verified and armband double check with second RN: yes, see MAR    Patient education offered and stated understanding. Denies questions at this time.    Patient arrived to CC treatment after being seen in clinic by Darci Current, APRN; please refer to clinic note for assessment details. Premeds given per treatment plan. Taxol densitization given w/o complications, patient tolerated well. Port positive for blood return, flushed with saline, and remains accessed for tx 12/6. Patient declined copy of labs and AVS. All questions and concerns addressed. Patient left CC treatment w/ all personal belongings in stable condition.

## 2021-01-02 ENCOUNTER — Encounter: Admit: 2021-01-02 | Discharge: 2021-01-02 | Payer: 59

## 2021-01-02 DIAGNOSIS — C569 Malignant neoplasm of unspecified ovary: Secondary | ICD-10-CM

## 2021-01-02 MED ORDER — MAGNESIUM SULFATE IN WATER 2 GRAM/50 ML (4 %) IV PGBK
2 g | Freq: Once | INTRAVENOUS | 0 refills | Status: CP
Start: 2021-01-02 — End: ?
  Administered 2021-01-02: 18:00:00 2 g via INTRAVENOUS

## 2021-01-02 MED ORDER — HEPARIN, PORCINE (PF) 100 UNIT/ML IV SYRG
500 [IU] | Freq: Once | 0 refills | Status: CP
Start: 2021-01-02 — End: ?

## 2021-01-02 MED ORDER — CARBOPLATIN DESENSITIZATION IVPB (SPEC VOL) BAG A
9 mg | Freq: Once | INTRAVENOUS | 0 refills | Status: CP
Start: 2021-01-02 — End: ?
  Administered 2021-01-02 (×2): 9 mg via INTRAVENOUS

## 2021-01-02 MED ORDER — SODIUM CHLORIDE 0.9 % IV SOLP
500 mL | Freq: Once | INTRAVENOUS | 0 refills | Status: CP
Start: 2021-01-02 — End: ?
  Administered 2021-01-02: 14:00:00 500 mL via INTRAVENOUS

## 2021-01-02 MED ORDER — APREPITANT 7.2 MG/ML IV EMUL
130 mg | Freq: Once | INTRAVENOUS | 0 refills | Status: CP
Start: 2021-01-02 — End: ?
  Administered 2021-01-02: 14:00:00 130 mg via INTRAVENOUS

## 2021-01-02 MED ORDER — DIPHENHYDRAMINE HCL 50 MG/ML IJ SOLN
50 mg | Freq: Once | INTRAVENOUS | 0 refills | Status: CP
Start: 2021-01-02 — End: ?
  Administered 2021-01-02: 14:00:00 50 mg via INTRAVENOUS

## 2021-01-02 MED ORDER — CARBOPLATIN DESENSITIZATION IVPB (SPEC VOL)(AUC-GOG) FINAL BAG
900 mg | Freq: Once | INTRAVENOUS | 0 refills | Status: CP
Start: 2021-01-02 — End: ?
  Administered 2021-01-02 (×2): 900 mg via INTRAVENOUS

## 2021-01-02 MED ORDER — CARBOPLATIN DESENSITIZATION IVPB (SPEC VOL) BAG B
90 mg | Freq: Once | INTRAVENOUS | 0 refills | Status: CP
Start: 2021-01-02 — End: ?
  Administered 2021-01-02 (×2): 90 mg via INTRAVENOUS

## 2021-01-02 MED ORDER — PALONOSETRON 0.25 MG/5 ML IV SOLN
.25 mg | Freq: Once | INTRAVENOUS | 0 refills | Status: CP
Start: 2021-01-02 — End: ?
  Administered 2021-01-02: 14:00:00 0.25 mg via INTRAVENOUS

## 2021-01-02 MED ORDER — POTASSIUM CHLORIDE IN 0.9%NACL 20 MEQ/L IV SOLP
1000 mL | Freq: Once | INTRAVENOUS | 0 refills | Status: CP
Start: 2021-01-02 — End: ?
  Administered 2021-01-02: 18:00:00 1000 mL via INTRAVENOUS

## 2021-01-02 MED ORDER — DEXAMETHASONE IVPB
20 mg | Freq: Once | INTRAVENOUS | 0 refills | Status: CP
Start: 2021-01-02 — End: ?
  Administered 2021-01-02 (×2): 20 mg via INTRAVENOUS

## 2021-01-02 MED ORDER — FAMOTIDINE (PF) 20 MG/2 ML IV SOLN
20 mg | Freq: Once | INTRAVENOUS | 0 refills | Status: CP
Start: 2021-01-02 — End: ?
  Administered 2021-01-02: 14:00:00 20 mg via INTRAVENOUS

## 2021-01-02 NOTE — Patient Instructions
Call Immediately to report the following:  Unexplained bleeding or bleeding that will not stop  Difficulty swallowing  Shortness of breath, wheezing, or trouble breathing  Rapid, irregular heartbeat; chest pain  Dizziness, lightheadedness  Rash or cut that swells or turns red, feels hot or painful, or begin to ooze  Diarrhea   Uncontrolled nausea or vomiting  Fever of 100.4 F or higher, or chills    Important Phone Numbers:  Cancer Center Main Number (answered 24 hours a day) [phone]  Cancer Center Scheduling (appointments) [phone]  Social Worker [phone]  Nutritionist [phone]

## 2021-01-02 NOTE — Progress Notes
CHEMO NOTE  Verified chemo consent signed and in chart.    Blood return positive via: Port (Single and Accessed)    BSA and dose double checked (agree with orders as written) with: yes     Labs/applicable tests checked: CBC and Comprehensive Metabolic Panel (CMP)    Chemo regimen: Drug/cycle/day Carboplatin IV C5 D2    Rate verified and armband double check with second RN: yes    Patient education offered and stated understanding. Denies questions at this time.    Chemo consent signed on 08/21/20.  Pt tolerated carboplatin desensitization infusions without incident.  Pt denies need for AVS, left ambulatory at 1506.

## 2021-01-16 ENCOUNTER — Encounter: Admit: 2021-01-16 | Discharge: 2021-01-16 | Payer: 59

## 2021-01-16 MED ORDER — CARBOPLATIN DESENSITIZATION IVPB (SPEC VOL) BAG A
9 mg | Freq: Once | INTRAVENOUS | 0 refills
Start: 2021-01-16 — End: ?

## 2021-01-16 MED ORDER — CARBOPLATIN DESENSITIZATION IVPB (SPEC VOL) BAG B
90 mg | Freq: Once | INTRAVENOUS | 0 refills
Start: 2021-01-16 — End: ?

## 2021-01-18 ENCOUNTER — Encounter: Admit: 2021-01-18 | Discharge: 2021-01-18 | Payer: 59

## 2021-01-19 ENCOUNTER — Encounter: Admit: 2021-01-19 | Discharge: 2021-01-19 | Payer: 59

## 2021-01-19 DIAGNOSIS — R112 Nausea with vomiting, unspecified: Secondary | ICD-10-CM

## 2021-01-19 DIAGNOSIS — U071 COVID: Secondary | ICD-10-CM

## 2021-01-19 DIAGNOSIS — C569 Malignant neoplasm of unspecified ovary: Secondary | ICD-10-CM

## 2021-01-19 DIAGNOSIS — R000 Tachycardia, unspecified: Secondary | ICD-10-CM

## 2021-01-19 DIAGNOSIS — J069 Acute upper respiratory infection, unspecified: Secondary | ICD-10-CM

## 2021-01-19 DIAGNOSIS — L7682 Other postprocedural complications of skin and subcutaneous tissue: Secondary | ICD-10-CM

## 2021-01-19 DIAGNOSIS — Z5111 Encounter for antineoplastic chemotherapy: Secondary | ICD-10-CM

## 2021-01-19 MED ORDER — GABAPENTIN 100 MG PO CAP
100 mg | ORAL_CAPSULE | Freq: Three times a day (TID) | ORAL | 1 refills | Status: AC | PRN
Start: 2021-01-19 — End: ?

## 2021-01-19 NOTE — Progress Notes
Obtained patient's verbal consent to treat them and their agreement to [facility] financial policy and NPP via this telehealth visit during the [facility] Emergency    Subjective     GYNECOLOGIC ONCOLOGY EVALUATION    Name:Kristina Velazquez    Date: 01/19/2021    Referring & Primary Care Physician: Jerene Dilling    Chief Complaint:   Chief Complaint   Patient presents with   ? Heme/Onc Care     History of Present Illness:  Kristina Velazquez is a 30 y.o. female with below history     Onc Timeline Overview Note   Kristina Velazquez is a 30 y.o. female patient of Dr. 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 Telehealth visit conducted via Zoom today due to COVID19 pandemic. Patient presents today for evaluation prior to C6 of Carbo/Taxol (both via desensitization).      Patient presents today with the following complaints. She is on the tail end of a mild cold/URI. PND and cough are improving. Nausea improved overall with olanzapine, although she admits this did make her more drowsy and one day she slept the entire day. She continues to experience tenderness at her incision site, concentrated most in the umbilical region. She takes a tramadol PRN, but admits she never gets complete relief. She had a hemorrhoid flare up last week, has been applying witch hazel on a cotton pad with resolution.     Otherwise, her appetite is good, she is hydrating well and her energy level is ok. She denies chest pain, SOA, cough, abdominal pain/bloating, vomiting, constipation/diarrhea, hematuria, hematochezia, vaginal bleeding/discharge, urinary frequency/urgency/dysuria, bone pain, LE edema, fevers/chills and peripheral neuropathy.       Ovarian cancer (HCC)   08/08/2020 Imaging    CT scan A/P demonstrated: Ill-defined complex pelvic mass with omental and mesenteric masses with intra-abdominal ascites suggesting a neoplastic process with metastatic disease. No other intraparenchymal metastases    CA125 = 862     08/16/2020 Biopsy    High grade serous carcinoma. ER 85%, PR 10%, positive PAX8, WT-1, p53     09/04/2020 -  Chemotherapy    Had reaction to both taxol and carbo in C1, plan for inpatient C2  OP GYN CARBOPLATIN (desensitization) + PACLITAXEL (desensitization) (OVARIAN)  Plan Provider: Eileen Stanford, MD  Treatment goal: Curative   Line of treatment: First Line     10/10/2020 Pertinent Labs    Caris:  Genomic LOH high  BRCA negative  MSI stable  CCNE1 not amplified  Her 2 neg    Pt would be candidate for PARP maintenance       10/25/2020 Imaging    CT CAP: no disease in chest. Decrease in size of the bilateral adnexal masses, peritoneal metastases, and malignant ascites suggesting favorable response to therapy by the patient's metastatic ovarian cancer.      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  Pregnancy Hx               Number of pregnancies: None               Number of live births:                Age of first live birth:                Did you breastfeed: No                            If Yes, how long?                Oral Birth Control:  Yes               Years: 8-9 years               Infertility Medication:  No               Year/Med Name:   ?  Menopausal Hx               Age of last period: N/A               Hormone Replacement Therapy:                 Years:   ?  Health Maintenence  Last Pap: 2021  Abn History of Pap: Denies  Colonoscopy: Denies  Mammogram: Denies  Bone scan: Denies  ?  Advanced Directives   DPOA: No  Living Will: No  ?  Past Medical History:  Medical History:   Diagnosis Date   ? COVID 01/29/2020   ? Inappropriate sinus tachycardia    ? Ovarian cancer [facility])      Past Surgical History:  Surgical History:   Procedure Laterality Date   ? HX SHOULDER SURGERY Right 2008   ? HX WISDOM TEETH EXTRACTION  2014   ? placement of port-a-cath with fluoroscopy Left 08/25/2020    Performed by Freund, Alecia Lemming., MD at [facility] OR   ? FLUOROSCOPIC GUIDANCE CENTRAL VENOUS ACCESS DEVICE PLACEMENT/ REPLACEMENT/ REMOVAL Left 08/25/2020    Performed by Freund, Alecia Lemming., MD at [facility] OR   ? ESOPHAGOGASTRODUODENOSCOPY WITH SPECIMEN COLLECTION BY BRUSHING/ WASHING N/A 11/13/2020    Performed by Comer Locket, MD at [facility] ENDO   ? COLONOSCOPY DIAGNOSTIC WITH SPECIMEN COLLECTION BY BRUSHING/ WASHING - FLEXIBLE N/A 11/13/2020    Performed by Comer Locket, MD at [facility] ENDO   ? TOTAL ABDOMINAL HYSTERECTOMY AND BILATERAL SALPINGO-OOPHORECTOMY WITH OMENTECTOMY AND RADICAL RESECTION FOR DEBULKING, APPENDECTOMY, Bilateral 11/14/2020    Performed by Eileen Stanford, MD at [facility] OR   ? Cytoreductive surgery; largest tumor approx. 6 cm Hyperthermic Intraperitoneal chemotherapy  N/A 11/14/2020    Performed by Dia Crawford, MD at [facility] OR   ? HX CHOLECYSTECTOMY       Medications:    Current Outpatient Medications:   ?  acetaminophen (ACETAMINOPHEN EXTRA STRENGTH) 500 mg tablet, Take one tablet to two tablets by mouth every 6  hours as needed for Pain. Max of 4,000 mg of acetaminophen in 24 hours., Disp: 60 tablet, Rfl: 1  ?  ALPRAZolam (XANAX) 1 mg tablet, Take 1 mg by mouth daily as needed for Anxiety., Disp: , Rfl:   ?  amphetamine-dextroamphetamine XR (ADDERALL XR) 30 mg capsule, Take 30 mg by mouth twice daily., Disp: , Rfl:   ?  dexAMETHasone (DECADRON) 4 mg tablet, Take two tablets by mouth daily. On Days 2-4 of each cycle., Disp: 36 tablet, Rfl: 0  ?  diphenhydrAMINE hcl (BENADRYL ALLERGY) 25 mg tablet, Take 25-50 mg by mouth at bedtime as needed., Disp: , Rfl:   ?  gabapentin (NEURONTIN) 100 mg capsule, Take one capsule by mouth three times daily as needed. Indications: neuropathic pain, Disp: 90 capsule, Rfl: 1  ?  ibuprofen (MOTRIN) 800 mg tablet, Take one tablet by mouth every 8 hours as needed. Take with food., Disp: 60 tablet, Rfl: 1  ?  lidocaine/prilocaine (EMLA) 2.5/2.5 % topical cream, Apply  topically to affected area as Needed. Apply 30-60 minutes prior to port access., Disp: 30 g, Rfl: 0  ?  melatonin 5 mg chew, Chew 10 mg by mouth at bedtime daily., Disp: , Rfl:   ?  MULTIVITAMIN PO, Take 1 tablet by mouth daily., Disp: , Rfl:   ?  OLANZapine (ZYPREXA) 5 mg tablet, Take one tab by mouth at bedtime daily on days 2-5 post chemotherapy.  Indications: nausea and vomiting caused by cancer drugs, Disp: 10 tablet, Rfl: 0  ?  ondansetron (ZOFRAN ODT) 4 mg rapid dissolve tablet, Dissolve one tablet by mouth every 8 hours as needed for Nausea or Vomiting. Place on tongue to dissolve., Disp: 30 tablet, Rfl: 1  ?  ondansetron HCL (ZOFRAN) 8 mg tablet, Take one tablet by mouth every 8 hours as needed (nausea and vomiting)., Disp: 30 tablet, Rfl: 3  ?  oxyCODONE (ROXICODONE) 5 mg tablet, Take one tablet by mouth every 6 hours as needed for Pain., Disp: 30 tablet, Rfl: 0  ?  pantoprazole DR (PROTONIX) 20 mg tablet, Take 40 mg by mouth at bedtime daily., Disp: , Rfl:   ?  polyethylene glycol 3350 (MIRALAX) 17 gram/dose powder, Take seventeen g by mouth daily., Disp: 510 g, Rfl: 1  ?  prochlorperazine maleate (COMPAZINE) 10 mg tablet, Take one tablet by mouth every 6 hours as needed for Nausea or Vomiting., Disp: 30 tablet, Rfl: 3  ?  senna/docusate (SENOKOT-S) 8.6/50 mg tablet, Take one tablet by mouth daily., Disp: 60 tablet, Rfl: 1  ?  traMADoL (ULTRAM) 50 mg tablet, Take 50 mg by mouth every 8 hours as needed for Pain., Disp: , Rfl:     Allergies:  Allergies   Allergen Reactions   ? Hydrocodone STOMACH UPSET and VOMITING   ? Paclitaxel CHEST TIGHTNESS, VISION CHANGES and REDNESS   ? Peanut RASH   ? Carboplatin FLUSHING (SKIN), HEADACHE and REDNESS     Social History:  Social History     Socioeconomic History   ? Marital status: Married   Tobacco Use   ? Smoking status: Never   ? Smokeless tobacco: Never   Vaping Use   ? Vaping Use: Never used   Substance and Sexual Activity   ? Alcohol use: Yes     Comment: OCASSIONALLY    ? Drug use: Never     Family History:  Family History   Problem Relation Age of Onset   ? Genetic Disorder Mother  BRCA1+   ? Cancer-Breast Mother 56   ? Cancer-Ovarian Mother 53   ? Heart Disease Mother    ? Depression Mother    ? Kidney Cancer Father 69   ? Hypertension Father    ? None Reported Sister    ? None Reported Brother    ? Unknown to Patient Paternal Uncle    ? Hypertension Maternal Grandmother    ? Arthritis-osteo Maternal Grandmother    ? Migraines Maternal Grandmother    ? Depression Maternal Grandmother    ? Heart Disease Maternal Grandfather    ? Hypertension Maternal Grandfather    ? Cancer-Breast Paternal Grandmother 30   ? Hypertension Paternal Grandfather    ? Stroke Paternal Grandfather      REVIEW OF SYSTEMS:        CONSTITUTIONAL: Negative unless stated in HPI  EYES: Negative unless stated in HPI  ENT: Negative unless stated in HPI  RESPIRATORY: Negative unless stated in HPI  CARDIOVASCULAR: Negative unless stated in HPI  GI: Negative unless stated in HPI  GU: Negative unless stated in HPI  MUSCULO-SKELETAL: Negative unless stated in HPI  SKIN: Negative unless stated in HPI  ENDOCRINE: Negative unless stated in HPI  HEMATOLOGIC: Negative unless stated in HPI    ECOG 1    Physical Exam:    LMP 08/17/2020   CONSTITUTIONAL: Alert. No acute distress. Normal weight. Normal appearance. HEENT: Wearing head wrap. Otherwise head is normocephalic and atraumatic. Conjunctivae normal.   PULMONARY: Pulmonary effort is normal.   NEUROLOGIC: Oriented x 4.   SKIN: No obvious rashes or lesions within viewable area.   PSYCHIATRIC/BEHAVIORAL: Mood, affect, thought content, judgement, and behavior are appropriate.     CBC w/Diff    Lab Results   Component Value Date/Time    WBC 6.9 01/01/2021 07:36 AM    RBC 4.30 01/01/2021 07:36 AM    HGB 12.6 01/01/2021 07:36 AM    HCT 37.0 01/01/2021 07:36 AM    MCV 86.1 01/01/2021 07:36 AM    MCH 29.3 01/01/2021 07:36 AM    MCHC 34.0 01/01/2021 07:36 AM    RDW 18.1 (H) 01/01/2021 07:36 AM    PLTCT 262 01/01/2021 07:36 AM    MPV 6.3 (L) 01/01/2021 07:36 AM    Lab Results   Component Value Date/Time    NEUT 64 01/01/2021 07:36 AM    ANC 4.50 01/01/2021 07:36 AM    LYMA 25 01/01/2021 07:36 AM    ALC 1.70 01/01/2021 07:36 AM    MONA 9 01/01/2021 07:36 AM    AMC 0.60 01/01/2021 07:36 AM    EOSA 1 01/01/2021 07:36 AM    AEC 0.10 01/01/2021 07:36 AM    BASA 1 01/01/2021 07:36 AM    ABC 0.00 01/01/2021 07:36 AM          Comprehensive Metabolic Profile    Lab Results   Component Value Date/Time    NA 137 01/01/2021 07:36 AM    K 4.2 01/01/2021 07:36 AM    CL 104 01/01/2021 07:36 AM    CO2 25 01/01/2021 07:36 AM    GAP 8 01/01/2021 07:36 AM    BUN 11 01/01/2021 07:36 AM    CR 0.68 01/01/2021 07:36 AM    GLU 97 01/01/2021 07:36 AM    Lab Results   Component Value Date/Time    CA 9.4 01/01/2021 07:36 AM    PO4 3.7 11/17/2020 04:09 AM    ALBUMIN 4.3 01/01/2021 07:36 AM  TOTPROT 7.4 01/01/2021 07:36 AM    ALKPHOS 62 01/01/2021 07:36 AM    AST 15 01/01/2021 07:36 AM    ALT 24 01/01/2021 07:36 AM    TOTBILI 0.4 01/01/2021 07:36 AM          Other labs    No results found for: ESR No results found for: LDH     Lab Results   Component Value Date    CA125 24 01/01/2021         ASSESSMENT/PLAN:  Kristina Velazquez is a 30 y.o. 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 Telehealth visit conducted via Zoom today due to COVID19 pandemic. Patient presents today for evaluation prior to C6 of Carbo/Taxol (both via desensitization).    ? Tolerated C5 well overall.   ? CINV: Improved with olanzapine 5 mg QHS D2-5.   ? URI Symptoms: Improving. May attempt OTC antihistamine such as claritin, allegra or zyrtec. Push PO fluids. May consider OTC mucinex as well.   ? Tenderness at Incision Site: Rx sent for Gabapentin 100 mg TID PRN.   ? CBC, CMP, CA125 and vitals to be repeated prior to treatment next week.   ? We will begin maintenance chemotherapy with olaparib therapy 2 to 4 weeks after she completes total of 3 cycles of adjuvant chemotherapy.   ? Patient verbalizes understanding of these instructions, denies further questions at this time.   ? CT CAP arranged for 1/12 and RV with Dr. [name] to review.     Ernest Haber Kristina Wafer, APRN-NP       Total Time Today was 20 minutes in the following activities: Preparing to see the patient, Obtaining and/or reviewing separately obtained history, Counseling and educating the patient/family/caregiver, Documenting clinical information in the electronic or other health record and Care coordination (not separately reported)

## 2021-01-23 ENCOUNTER — Encounter: Admit: 2021-01-23 | Discharge: 2021-01-23 | Payer: 59

## 2021-01-23 DIAGNOSIS — C569 Malignant neoplasm of unspecified ovary: Secondary | ICD-10-CM

## 2021-01-23 MED ORDER — DEXAMETHASONE IVPB
20 mg | Freq: Once | INTRAVENOUS | 0 refills | Status: CP
Start: 2021-01-23 — End: ?
  Administered 2021-01-23 (×2): 20 mg via INTRAVENOUS

## 2021-01-23 MED ORDER — PACLITAXEL DESENSITIZATION IVPB (NON-DEHP/PVC)(SPEC VOL) BAG B
37.6 mg | Freq: Once | INTRAVENOUS | 0 refills | Status: CP
Start: 2021-01-23 — End: ?
  Administered 2021-01-23 (×2): 37.6 mg via INTRAVENOUS

## 2021-01-23 MED ORDER — DIPHENHYDRAMINE HCL 50 MG/ML IJ SOLN
50 mg | Freq: Once | INTRAVENOUS | 0 refills | Status: CP
Start: 2021-01-23 — End: ?
  Administered 2021-01-23: 15:00:00 50 mg via INTRAVENOUS

## 2021-01-23 MED ORDER — FAMOTIDINE (PF) 20 MG/2 ML IV SOLN
20 mg | Freq: Once | INTRAVENOUS | 0 refills | Status: CP
Start: 2021-01-23 — End: ?
  Administered 2021-01-23: 14:00:00 20 mg via INTRAVENOUS

## 2021-01-23 MED ORDER — PACLITAXEL DESENSITIZATION IVPB (NON-DEHP/PVC)(SPEC VOL) FINAL BAG
175 mg/m2 | Freq: Once | INTRAVENOUS | 0 refills | Status: CP
Start: 2021-01-23 — End: ?
  Administered 2021-01-23 (×2): 376.248 mg via INTRAVENOUS

## 2021-01-23 MED ORDER — HEPARIN, PORCINE (PF) 100 UNIT/ML IV SYRG
500 [IU] | Freq: Once | 0 refills | Status: CP
Start: 2021-01-23 — End: ?

## 2021-01-23 MED ORDER — PACLITAXEL DESENSITIZATION IVPB (NON-DEHP/PVC)(SPEC VOL) BAG A
3.8 mg | Freq: Once | INTRAVENOUS | 0 refills | Status: CP
Start: 2021-01-23 — End: ?
  Administered 2021-01-23 (×2): 3.8 mg via INTRAVENOUS

## 2021-01-24 ENCOUNTER — Encounter: Admit: 2021-01-24 | Discharge: 2021-01-24 | Payer: 59

## 2021-01-24 DIAGNOSIS — C569 Malignant neoplasm of unspecified ovary: Secondary | ICD-10-CM

## 2021-01-24 MED ORDER — METHYLPREDNISOLONE SOD SUC(PF) 125 MG/2 ML IJ SOLR
125 mg | Freq: Once | INTRAVENOUS | 0 refills | Status: CP | PRN
Start: 2021-01-24 — End: ?
  Administered 2021-01-24: 19:00:00 125 mg via INTRAVENOUS

## 2021-01-24 MED ORDER — DEXAMETHASONE IVPB
20 mg | Freq: Once | INTRAVENOUS | 0 refills | Status: CP
Start: 2021-01-24 — End: ?
  Administered 2021-01-24 (×2): 20 mg via INTRAVENOUS

## 2021-01-24 MED ORDER — HEPARIN, PORCINE (PF) 100 UNIT/ML IV SYRG
500 [IU] | Freq: Once | 0 refills | Status: CP
Start: 2021-01-24 — End: ?

## 2021-01-24 MED ORDER — CARBOPLATIN DESENSITIZATION IVPB (SPEC VOL) BAG A
9 mg | Freq: Once | INTRAVENOUS | 0 refills | Status: CP
Start: 2021-01-24 — End: ?
  Administered 2021-01-24 (×2): 9 mg via INTRAVENOUS

## 2021-01-24 MED ORDER — SODIUM CHLORIDE 0.9 % IV SOLP
500 mL | Freq: Once | INTRAVENOUS | 0 refills | Status: CP
Start: 2021-01-24 — End: ?
  Administered 2021-01-24: 14:00:00 500 mL via INTRAVENOUS

## 2021-01-24 MED ORDER — CARBOPLATIN DESENSITIZATION IVPB (SPEC VOL)(AUC-GOG) FINAL BAG
900 mg | Freq: Once | INTRAVENOUS | 0 refills | Status: CP
Start: 2021-01-24 — End: ?
  Administered 2021-01-24 (×2): 900 mg via INTRAVENOUS

## 2021-01-24 MED ORDER — APREPITANT 7.2 MG/ML IV EMUL
130 mg | Freq: Once | INTRAVENOUS | 0 refills | Status: CP
Start: 2021-01-24 — End: ?
  Administered 2021-01-24: 14:00:00 130 mg via INTRAVENOUS

## 2021-01-24 MED ORDER — POTASSIUM CHLORIDE IN 0.9%NACL 20 MEQ/L IV SOLP
1000 mL | Freq: Once | INTRAVENOUS | 0 refills | Status: CP
Start: 2021-01-24 — End: ?
  Administered 2021-01-24: 18:00:00 1000 mL via INTRAVENOUS

## 2021-01-24 MED ORDER — ALBUTEROL SULFATE 2.5 MG /3 ML (0.083 %) IN NEBU
2.5 mg | Freq: Once | RESPIRATORY_TRACT | 0 refills | Status: AC | PRN
Start: 2021-01-24 — End: ?

## 2021-01-24 MED ORDER — DIPHENHYDRAMINE HCL 50 MG/ML IJ SOLN
50 mg | Freq: Once | INTRAVENOUS | 0 refills | Status: CP
Start: 2021-01-24 — End: ?
  Administered 2021-01-24: 14:00:00 50 mg via INTRAVENOUS

## 2021-01-24 MED ORDER — SODIUM CHLORIDE 0.9 % IV SOLP
500 mL | Freq: Once | INTRAVENOUS | 0 refills | Status: CP
Start: 2021-01-24 — End: ?
  Administered 2021-01-24: 19:00:00 500 mL via INTRAVENOUS

## 2021-01-24 MED ORDER — FAMOTIDINE (PF) 20 MG/2 ML IV SOLN
20 mg | Freq: Once | INTRAVENOUS | 0 refills | Status: CP | PRN
Start: 2021-01-24 — End: ?

## 2021-01-24 MED ORDER — MAGNESIUM SULFATE IN WATER 2 GRAM/50 ML (4 %) IV PGBK
2 g | Freq: Once | INTRAVENOUS | 0 refills | Status: CP
Start: 2021-01-24 — End: ?
  Administered 2021-01-24: 18:00:00 2 g via INTRAVENOUS

## 2021-01-24 MED ORDER — SODIUM CHLORIDE 0.9 % IV SOLP
1000 mL | Freq: Once | INTRAVENOUS | 0 refills | Status: DC
Start: 2021-01-24 — End: 2021-01-24

## 2021-01-24 MED ORDER — FAMOTIDINE (PF) 20 MG/2 ML IV SOLN
20 mg | Freq: Once | INTRAVENOUS | 0 refills | Status: CP
Start: 2021-01-24 — End: ?
  Administered 2021-01-24: 14:00:00 20 mg via INTRAVENOUS

## 2021-01-24 MED ORDER — DIPHENHYDRAMINE HCL 50 MG/ML IJ SOLN
50 mg | Freq: Once | INTRAVENOUS | 0 refills | Status: CP | PRN
Start: 2021-01-24 — End: ?
  Administered 2021-01-24: 19:00:00 50 mg via INTRAVENOUS

## 2021-01-24 MED ORDER — CARBOPLATIN DESENSITIZATION IVPB (SPEC VOL) BAG B
90 mg | Freq: Once | INTRAVENOUS | 0 refills | Status: CP
Start: 2021-01-24 — End: ?
  Administered 2021-01-24 (×2): 90 mg via INTRAVENOUS

## 2021-01-24 MED ORDER — PALONOSETRON 0.25 MG/5 ML IV SOLN
.25 mg | Freq: Once | INTRAVENOUS | 0 refills | Status: CP
Start: 2021-01-24 — End: ?
  Administered 2021-01-24: 14:00:00 0.25 mg via INTRAVENOUS

## 2021-01-24 MED ADMIN — FAMOTIDINE (PF) 20 MG/2 ML IV SOLN [166077]: 20 mg | INTRAVENOUS | @ 19:00:00 | Stop: 2021-01-24 | NDC 67457043300

## 2021-01-24 NOTE — Progress Notes
CHEMO NOTE  Verified chemo consent signed and in chart.    Blood return positive via: Port (Single and Power Port)    BSA and dose double checked (agree with orders as written) with: yes see MAR    Labs/applicable tests checked: CBC and Comprehensive Metabolic Panel (CMP)    Chemo regimen: Drug/cycle/day: C6D1   -PACLitaxeL (TAXOL) 3.8 mg in sodium chloride 0.9% (NS) 250 mL IVPB (non-DEHP/PVC)(Desensitization Bag A)     -PACLitaxeL (TAXOL) 37.6 mg in sodium chloride 0.9% (NS) 250 mL IVPB (non-DEHP/PVC)(Desensitization Bag B)   -PACLitaxeL (TAXOL) 376.248 mg in sodium chloride 0.9% (NS) 500 mL IVPB (non-DEHP/PVC)(Desensitization Final Bag)     Rate verified and armband double check with second RN: yes    Patient education offered and stated understanding. Denies questions at this time.      Patient arrived to [facility] treatment for C6D1 of Taxol Desens. Patient has no question, concerns or complaints at this time. 1600 Pt received treatment as ordered without incidences. Pt's PAC was flushed and left accessed for treatment tomorrow. Pt left ambulatory with no further questions.

## 2021-01-25 NOTE — Progress Notes
Chemotherapy Reaction    Patient: Kristina Velazquez is a 30 y.o. female    MRN#:  [id number]    Chemotherapy Cycle 6 Day 2    Agent: carboplatin    Route: IV    Time Started: 0843 carboplatin bag A    Time of Reaction: 1234    Rate at Time of Reaction: 75 mL/hr    Signs & Symptoms of Reaction: rash located neck, chest and back of ears, dyspnea, hypertension with BP 140/94, tachycardia with HR 138 and chest tightness    Medications Administered: normal saline bolus, diphenhydramine, famotidine and methylprednisolone    Provider Notification (Name/Time): Dr. Martin Majestic was notified at 1240    Time of Symptom Resolution: 1320    Intervention/Outcome:  At 1234, patient began c/o shortness of breath, chest tightness, and developed rash on neck, chest and back of ears. Patient was hypertensive 140/94, heart rate increased to 138 and oxygen was stable at 94%. Patient was given IV benadryl and pepcid and NS bolus. Patient vital signs remained stable, Dr. Martin Majestic was notified at 1240. Order for IV solumedrol was placed and given to patient at 1243. At 1320 symptoms resolved and rash was no longer present. Dr. Martin Majestic was notified and orders to restart carboplatin at 50 mL/hr if tolerated for remainder of infusion. Carboplatin was restarted at 1333 and patient tolerated remainder of infusion with no issues.     Allergy List Updated with Reaction: No, already listed as an allergy.

## 2021-01-25 NOTE — Patient Instructions
[facility]  Chemotherapy Instructions    Florina Glas 01/24/2021    Call Immediately to report the following:  Unexplained bleeding or bleeding that will not stop  Difficulty swallowing  Shortness of breath, wheezing, or trouble breathing  Rapid, irregular heartbeat; chest pain  Dizziness, lightheadedness  Rash or cut that swells or turns red, feels hot or painful, or begin to ooze  Diarrhea   Uncontrolled nausea or vomiting  Fever of 100.4 F or higher, or chills    Important Phone Numbers:  [place] Number (answered 24 hours a day) [place] (appointments) [phone]  Social Worker [phone]

## 2021-01-30 ENCOUNTER — Encounter: Admit: 2021-01-30 | Discharge: 2021-01-30 | Payer: 59

## 2021-02-08 ENCOUNTER — Ambulatory Visit: Admit: 2021-02-08 | Discharge: 2021-02-08 | Payer: 59

## 2021-02-08 ENCOUNTER — Encounter: Admit: 2021-02-08 | Discharge: 2021-02-08 | Payer: 59

## 2021-02-08 DIAGNOSIS — C569 Malignant neoplasm of unspecified ovary: Secondary | ICD-10-CM

## 2021-02-08 LAB — CBC AND DIFF
ABSOLUTE BASO COUNT: 0 K/UL (ref 0–0.20)
ABSOLUTE EOS COUNT: 0 K/UL (ref 0–0.45)
ABSOLUTE LYMPH COUNT: 1.4 K/UL (ref 1.0–4.8)
ABSOLUTE MONO COUNT: 0.9 K/UL — ABNORMAL HIGH (ref 0–0.80)
ABSOLUTE NEUTROPHIL: 1.3 K/UL — ABNORMAL LOW (ref 1.8–7.0)
BASOPHILS %: 1 % (ref 0–2)
EOSINOPHILS %: 1 % (ref >60)
HEMATOCRIT: 35 % — ABNORMAL LOW (ref 36–45)
HEMOGLOBIN: 12 g/dL (ref 12.0–15.0)
LYMPHOCYTES %: 38 % (ref 24–44)
MCH: 29 pg (ref 26–34)
MCHC: 34 g/dL (ref 32.0–36.0)
MCV: 86 FL (ref 80–100)
MONOCYTES %: 24 % — ABNORMAL HIGH (ref 4–12)
MPV: 6.7 FL — ABNORMAL LOW (ref 7–11)
NEUTROPHILS %: 36 % — ABNORMAL LOW (ref 41–77)
PLATELET COUNT: 303 K/UL (ref 150–400)
RBC COUNT: 4.1 M/UL — ABNORMAL HIGH (ref 4.0–5.0)
RDW: 17 % — ABNORMAL HIGH (ref 11–15)
WBC COUNT: 3.8 K/UL — ABNORMAL LOW (ref 4.5–11.0)

## 2021-02-08 LAB — CA125: CA-125: 14 U/mL (ref <35)

## 2021-02-08 LAB — COMPREHENSIVE METABOLIC PANEL
POTASSIUM: 3.9 MMOL/L (ref 3.5–5.1)
SODIUM: 138 MMOL/L (ref 137–147)

## 2021-02-08 MED ORDER — SODIUM CHLORIDE 0.9 % IJ SOLN
50 mL | Freq: Once | INTRAVENOUS | 0 refills | Status: CP
Start: 2021-02-08 — End: ?
  Administered 2021-02-08: 16:00:00 50 mL via INTRAVENOUS

## 2021-02-08 MED ORDER — HEPARIN, PORCINE (PF) 100 UNIT/ML IV SYRG
500 [IU] | Freq: Once | 0 refills | Status: CP
Start: 2021-02-08 — End: ?

## 2021-02-08 MED ORDER — IOHEXOL 350 MG IODINE/ML IV SOLN
100 mL | Freq: Once | INTRAVENOUS | 0 refills | Status: CP
Start: 2021-02-08 — End: ?
  Administered 2021-02-08: 16:00:00 100 mL via INTRAVENOUS

## 2021-02-08 NOTE — Progress Notes
Subjective     GYNECOLOGIC ONCOLOGY EVALUATION    Name:Kristina Velazquez    Date: 02/09/2021    Referring & Primary Care Physician: Jerene Dilling    Chief Complaint:   Chief Complaint   Patient presents with   ? Heme/Onc Care     History of Present Illness:    Kristina Velazquez is a 31 y.o. female with below history         Onc Timeline Overview Note   Kristina Velazquez is a 31 y.o. female patient of Dr. 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 Here today after completion of  adjuvant chemo.    She notes some residual fatigue after completing her last cycle of chemo 01/24/21. She thinks some of this is related to recently returning to work as an Psychologist, educational. 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 She has some mild pain surrounding an area of thickened scar tissue on her midline abdominal incision. She is also interested in starting treatment for menopausal symptoms, primarily severe hot flashes with sweating throughout the day.     She denies vaginal bleeding or discharge. Denies cough or shortness of breath. Denies changes in bowel or bladder habits.        Ovarian cancer (HCC)   08/08/2020 Imaging    CT scan A/P demonstrated: Ill-defined complex pelvic mass with omental and mesenteric masses with intra-abdominal ascites suggesting a neoplastic process with metastatic disease. No other intraparenchymal metastases    CA125 = 862     08/16/2020 Biopsy    High grade serous carcinoma. ER 85%, PR 10%, positive PAX8, WT-1, p53     09/04/2020 - 01/24/2021 Chemotherapy    Had reaction to both taxol and carbo in C1, plan for inpatient C2  OP GYN CARBOPLATIN (desensitization) + PACLITAXEL (desensitization) (OVARIAN)  Plan Provider: Eileen Stanford, MD  Treatment goal: Curative   Line of treatment: First Line     10/10/2020 Pertinent Labs    Caris:  Genomic LOH high  BRCA negative  MSI stable  CCNE1 not amplified  Her 2 neg    Pt would be candidate for PARP maintenance       10/25/2020 Imaging    CT CAP: no disease in chest. Decrease in size of the bilateral adnexal masses, peritoneal metastases, and malignant ascites suggesting favorable response to therapy by the patient's metastatic ovarian cancer.      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  No new thoracic metastatic disease.       ABDOMEN AND PELVIS:   1.  Interval hysterectomy, omentectomy and tumor debulking. Minimal   residual areas of peritoneal thickening and nodularity, indeterminate and   may be postoperative scarring or from minute residual/recurrent tumor. No   ascites.     2. No new abdominopelvic lymphadenopathy.  02/23/2021 -  Chemotherapy    OP GYN OLAPARIB  Plan Provider: Eileen Stanford, MD  Treatment goal: Control  Line of treatment: Maintenance       Reproductive History  Menstrual Hx               LMP: 07/22/20               Having Periods:  Yes               Age at first period: 19  ?  Pregnancy Hx               Number of pregnancies: None               Number of live births:                Age of first live birth:                Did you breastfeed: No                            If Yes, how long?                Oral Birth Control:  Yes               Years: 8-9 years               Infertility Medication:  No               Year/Med Name:   ?  Menopausal Hx               Age of last period: N/A               Hormone Replacement Therapy:                 Years:   ?  Health Maintenence  Last Pap: 2021  Abn History of Pap: Denies  Colonoscopy: Denies  Mammogram: Denies  Bone scan: Denies  ?  Advanced Directives   DPOA: No  Living Will: No  ?  Past Medical History:  Medical History:   Diagnosis Date   ? COVID 01/29/2020   ? Inappropriate sinus tachycardia    ? Ovarian cancer [facility])      Past Surgical History:  Surgical History:   Procedure Laterality Date   ? HX SHOULDER SURGERY Right 2008   ? HX WISDOM TEETH EXTRACTION  2014   ? placement of port-a-cath with fluoroscopy Left 08/25/2020    Performed by Freund, Alecia Lemming., MD at [facility] OR   ? FLUOROSCOPIC GUIDANCE CENTRAL VENOUS ACCESS DEVICE PLACEMENT/ REPLACEMENT/ REMOVAL Left 08/25/2020    Performed by Freund, Alecia Lemming., MD at [facility] OR   ? ESOPHAGOGASTRODUODENOSCOPY WITH SPECIMEN COLLECTION BY BRUSHING/ WASHING N/A 11/13/2020    Performed by Comer Locket, MD at [facility] ENDO   ? COLONOSCOPY DIAGNOSTIC WITH SPECIMEN COLLECTION BY BRUSHING/ WASHING - FLEXIBLE N/A 11/13/2020    Performed by Comer Locket, MD at [facility] ENDO   ? TOTAL ABDOMINAL HYSTERECTOMY AND BILATERAL SALPINGO-OOPHORECTOMY WITH OMENTECTOMY AND RADICAL RESECTION FOR DEBULKING, APPENDECTOMY, Bilateral 11/14/2020    Performed by Eileen Stanford, MD at [facility] OR   ? Cytoreductive surgery; largest tumor approx. 6 cm Hyperthermic Intraperitoneal chemotherapy  N/A 11/14/2020    Performed by Dia Crawford, MD at [facility] OR   ? HX  CHOLECYSTECTOMY       Medications:    Current Outpatient Medications:   ?  acetaminophen (ACETAMINOPHEN EXTRA STRENGTH) 500 mg tablet, Take one tablet to two tablets by mouth every 6 hours as needed for Pain. Max of 4,000 mg of acetaminophen in 24 hours., Disp: 60 tablet, Rfl: 1  ?  ALPRAZolam (XANAX) 1 mg tablet, Take 1 mg by mouth daily as needed for Anxiety., Disp: , Rfl:   ?  amphetamine-dextroamphetamine XR (ADDERALL XR) 30 mg capsule, Take 30 mg by mouth twice daily., Disp: , Rfl:   ?  dexAMETHasone (DECADRON) 4 mg tablet, Take two tablets by mouth daily. On Days 2-4 of each cycle., Disp: 36 tablet, Rfl: 0  ?  diphenhydrAMINE hcl (BENADRYL ALLERGY) 25 mg tablet, Take 25-50 mg by mouth at bedtime as needed., Disp: , Rfl:   ?  estradioL (ESTRACE) 0.5 mg tablet, Take one tablet by mouth daily., Disp: 30 tablet, Rfl: 3  ?  gabapentin (NEURONTIN) 100 mg capsule, Take one capsule by mouth three times daily as needed. Indications: neuropathic pain, Disp: 90 capsule, Rfl: 1  ?  ibuprofen (MOTRIN) 800 mg tablet, Take one tablet by mouth every 8 hours as needed. Take with food., Disp: 60 tablet, Rfl: 1  ?  lidocaine/prilocaine (EMLA) 2.5/2.5 % topical cream, Apply  topically to affected area as Needed. Apply 30-60 minutes prior to port access., Disp: 30 g, Rfl: 0  ?  melatonin 5 mg chew, Chew 10 mg by mouth at bedtime daily., Disp: , Rfl:   ?  MULTIVITAMIN PO, Take 1 tablet by mouth daily., Disp: , Rfl:   ?  OLANZapine (ZYPREXA) 5 mg tablet, Take one tab by mouth at bedtime daily on days 2-5 post chemotherapy.  Indications: nausea and vomiting caused by cancer drugs, Disp: 10 tablet, Rfl: 0  ?  ondansetron (ZOFRAN ODT) 4 mg rapid dissolve tablet, Dissolve one tablet by mouth every 8 hours as needed for Nausea or Vomiting. Place on tongue to dissolve., Disp: 30 tablet, Rfl: 1  ?  ondansetron HCL (ZOFRAN) 8 mg tablet, Take one tablet by mouth every 8 hours as needed (nausea and vomiting)., Disp: 30 tablet, Rfl: 3  ?  oxyCODONE (ROXICODONE) 5 mg tablet, Take one tablet by mouth every 6 hours as needed for Pain., Disp: 30 tablet, Rfl: 0  ?  pantoprazole DR (PROTONIX) 20 mg tablet, Take 40 mg by mouth at bedtime daily., Disp: , Rfl:   ?  polyethylene glycol 3350 (MIRALAX) 17 gram/dose powder, Take seventeen g by mouth daily., Disp: 510 g, Rfl: 1  ?  prochlorperazine maleate (COMPAZINE) 10 mg tablet, Take one tablet by mouth every 6 hours as needed for Nausea or Vomiting., Disp: 30 tablet, Rfl: 3  ?  propranoloL (INDERAL) 10 mg tablet, Take one tablet by mouth every 6 hours as needed. 1 every 6 hours as needed for tremor, Disp: 90 tablet, Rfl: 3  ?  senna/docusate (SENOKOT-S) 8.6/50 mg tablet, Take one tablet by mouth daily., Disp: 60 tablet, Rfl: 1  ?  traMADoL (ULTRAM) 50 mg tablet, Take 50 mg by mouth every 8 hours as needed for Pain., Disp: , Rfl:     Allergies:  Allergies   Allergen Reactions   ? Carboplatin CHEST TIGHTNESS, FLUSHING (SKIN), HEADACHE, RASH, SHORTNESS OF BREATH, SEE COMMENTS and REDNESS     Did not complete the remainder of the infusion.  Additional reaction 01/24/21 during bag A, rash located neck, chest and back of ears, dyspnea,  hypertension with BP 140/94, tachycardia with HR 138 and chest tightness.  Completed the remainder of the infusion.   ? Hydrocodone STOMACH UPSET and VOMITING   ? Paclitaxel CHEST TIGHTNESS, VISION CHANGES and REDNESS   ? Peanut RASH     Social History:  Social History     Socioeconomic History   ? Marital status: Married   Tobacco Use   ? Smoking status: Never   ? Smokeless tobacco: Never   Vaping Use   ? Vaping Use: Never used   Substance and Sexual Activity   ? Alcohol use: Yes     Comment: OCASSIONALLY    ? Drug use: Never     Family History:  Family History   Problem Relation Age of Onset   ? Genetic Disorder Mother         BRCA1+   ? Cancer-Breast Mother 62   ? Cancer-Ovarian Mother 29   ? Heart Disease Mother    ? Depression Mother    ? Kidney Cancer Father 4   ? Hypertension Father    ? None Reported Sister    ? None Reported Brother    ? Unknown to Patient Paternal Uncle    ? Hypertension Maternal Grandmother    ? Arthritis-osteo Maternal Grandmother    ? Migraines Maternal Grandmother    ? Depression Maternal Grandmother    ? Heart Disease Maternal Grandfather    ? Hypertension Maternal Grandfather    ? Cancer-Breast Paternal Grandmother 2   ? Hypertension Paternal Grandfather    ? Stroke Paternal Grandfather      REVIEW OF SYSTEMS:        CONSTITUTIONAL: Negative unless stated in HPI  EYES: Negative unless stated in HPI  ENT: Negative unless stated in HPI  RESPIRATORY: Negative unless stated in HPI  CARDIOVASCULAR: Negative unless stated in HPI  GI: Negative unless stated in HPI  GU: Negative unless stated in HPI  MUSCULO-SKELETAL: Negative unless stated in HPI  SKIN: Negative unless stated in HPI  ENDOCRINE: Negative unless stated in HPI  HEMATOLOGIC: Negative unless stated in HPI    ECOG 1    Physical Exam:    BP (!) 142/96 (BP Source: Arm, Left Upper, Patient Position: Sitting)  - Pulse (!) 126  - Temp 36.3 ?C (97.3 ?F) (Temporal)  - Ht 173.8 cm (5' 8.43) Comment: 02/09/21 - Wt 96.8 kg (213 lb 6.4 oz)  - LMP 08/17/2020  - SpO2 99%  - BMI 32.05 kg/m?   GENERAL APPEARANCE: Appears healthy.  Alert; in no acute distress.  Pleasant   HEENT: Alopecia, wearing head wrap. Otherwise, unremarkable. No tenderness or masses noted.  NECK: Neck supple. No tenderness. No adenopathy.    LUNGS: Chest symmetrical. Good diaphragmatic excursion. Lungs clear; normal breath sounds.  CARDIOVASCULAR:  RRR. Heart sounds normal.      ABDOMEN: Soft, non-tender and non-distended. No obvious masses, organomegaly or hernia. Incision well healed with 3 cm area of thickened scar tissue superior to umbilicus that is slowly resolving.  Pelvic: normal external genitalia, normal appearing cervix and vaginal mucosa. Cervix, uterus, adnexae surgically absent. Vaginal cuff intact without masses or lesions. No nodularity noted on bimanual exam.   EXTREMITIES: Extremities normal. No joint deformities, edema, or skin discoloration. Station and gait normal. Noted slight tremor in hands today.   SKIN: Skin color, texture, turgor normal. No rashes or lesions.    CBC w/Diff    Lab Results   Component Value Date/Time    WBC  3.8 (L) 02/08/2021 10:23 AM    RBC 4.14 02/08/2021 10:23 AM    HGB 12.3 02/08/2021 10:23 AM    HCT 35.9 (L) 02/08/2021 10:23 AM    MCV 86.6 02/08/2021 10:23 AM    MCH 29.7 02/08/2021 10:23 AM    MCHC 34.3 02/08/2021 10:23 AM    RDW 17.0 (H) 02/08/2021 10:23 AM    PLTCT 303 02/08/2021 10:23 AM    MPV 6.7 (L) 02/08/2021 10:23 AM    Lab Results   Component Value Date/Time    NEUT 36 (L) 02/08/2021 10:23 AM    ANC 1.37 (L) 02/08/2021 10:23 AM    LYMA 38 02/08/2021 10:23 AM    ALC 1.48 02/08/2021 10:23 AM    MONA 24 (H) 02/08/2021 10:23 AM    AMC 0.92 (H) 02/08/2021 10:23 AM    EOSA 1 02/08/2021 10:23 AM    AEC 0.05 02/08/2021 10:23 AM    BASA 1 02/08/2021 10:23 AM    ABC 0.02 02/08/2021 10:23 AM          Comprehensive Metabolic Profile    Lab Results   Component Value Date/Time    NA 138 02/08/2021 10:23 AM    K 3.9 02/08/2021 10:23 AM    CL 104 02/08/2021 10:23 AM    CO2 25 02/08/2021 10:23 AM    GAP 9 02/08/2021 10:23 AM    BUN 9 02/08/2021 10:23 AM    CR 0.76 02/08/2021 10:23 AM    GLU 110 (H) 02/08/2021 10:23 AM    Lab Results   Component Value Date/Time    CA 9.7 02/08/2021 10:23 AM    PO4 3.7 11/17/2020 04:09 AM    ALBUMIN 4.2 02/08/2021 10:23 AM    TOTPROT 7.1 02/08/2021 10:23 AM    ALKPHOS 58 02/08/2021 10:23 AM    AST 23 02/08/2021 10:23 AM    ALT 35 02/08/2021 10:23 AM    TOTBILI 0.5 02/08/2021 10:23 AM          Other labs    No results found for: ESR No results found for: LDH     Lab Results   Component Value Date    CA125 14 02/08/2021          ASSESSMENT/PLAN:  Saiya Crist is a 31 y.o. 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 Discussed taking with anti-emetic initially and maintaining a regular bowel regimen. Reviewed basic risk and benefits and signed consent with patient today.   Will set up pharmacy visit as well  -Exam without signs of recurrence  -Reviewed CT C/A/P. Areas of peritoneal thickening and nodularity likely scar tissue, will continue to follow.   -Hand tremor - will start propranolol 10 mg q6h PRN  -Hand numbness - consistent with possible carpal tunnel or other nerve compression. Recommend f/u with PCP if not improving. Discussed possible PT or OT consult and might need a brace  -Vasomotor symptoms - start estradiol 0.5 mg daily, will titrate as needed for symptomatic management. Discussed that there is no contraindication  -Plan for q12months CA125 and imaging based on symptoms or elevation in CA125  -RV for second touch    Chloe Marden Noble, MD  PGY-2 Obstetrics & Gynecology    Eileen Stanford, MD             Total Time  Today was 45 minutes in the following activities: Preparing to see the patient, Obtaining and/or reviewing separately obtained history, Performing a medically appropriate examination and/or evaluation, Counseling and educating the patient/family/caregiver, Ordering medications, tests, or procedures, Referring and communication with other health care professionals (when not separately reported), Documenting clinical information in the electronic or other health record, Independently interpreting results (not separately reported) and communicating results to the patient/family/caregiver and Care coordination (not separately reported)

## 2021-02-09 ENCOUNTER — Encounter: Admit: 2021-02-09 | Discharge: 2021-02-09 | Payer: 59

## 2021-02-09 DIAGNOSIS — U071 COVID: Secondary | ICD-10-CM

## 2021-02-09 DIAGNOSIS — C569 Malignant neoplasm of unspecified ovary: Secondary | ICD-10-CM

## 2021-02-09 DIAGNOSIS — R000 Tachycardia, unspecified: Secondary | ICD-10-CM

## 2021-02-09 MED ORDER — ESTRADIOL 0.5 MG PO TAB
0.5 mg | ORAL_TABLET | Freq: Every day | ORAL | 3 refills | 30.00000 days | Status: AC
Start: 2021-02-09 — End: ?

## 2021-02-09 MED ORDER — PROPRANOLOL 10 MG PO TAB
10 mg | ORAL_TABLET | ORAL | 3 refills | Status: AC | PRN
Start: 2021-02-09 — End: ?

## 2021-02-09 MED ORDER — CONJUGATED ESTROGENS 0.625 MG PO TAB
.625 mg | ORAL_TABLET | Freq: Every day | ORAL | 3 refills | 31.00000 days | Status: DC
Start: 2021-02-09 — End: 2021-02-09

## 2021-02-14 ENCOUNTER — Encounter: Admit: 2021-02-14 | Discharge: 2021-02-14 | Payer: 59

## 2021-02-14 DIAGNOSIS — C569 Malignant neoplasm of unspecified ovary: Secondary | ICD-10-CM

## 2021-02-14 MED ORDER — ONDANSETRON HCL 8 MG PO TAB
ORAL_TABLET | Freq: Two times a day (BID) | ORAL | 3 refills | 8.00000 days | Status: AC
Start: 2021-02-14 — End: ?

## 2021-02-14 MED ORDER — OLAPARIB 150 MG PO TAB
300 mg | ORAL_TABLET | Freq: Two times a day (BID) | ORAL | 3 refills | Status: AC
Start: 2021-02-14 — End: ?

## 2021-02-14 MED ORDER — LIDOCAINE-PRILOCAINE 2.5-2.5 % TP CREA
TOPICAL | 0 refills | Status: AC | PRN
Start: 2021-02-14 — End: ?

## 2021-02-14 NOTE — Progress Notes
Oral Chemotherapy Counseling  Olaparib [name])    Ercelle Winkles was provided medication education regarding her new oral chemotherapy.     I reviewed the role of [facility] specialty pharmacy, including access to medication assistance specialists if needed.     How to Take the Medication  Linnet Bottari was educated on Olaparib [name]) for first-line maintenance of ovarian cancer.  Dose, route, frequency and duration of therapy were discussed.     ? Directions: Take 300 mg (tso 150-mg tablets) by mouth twice daily (12 hours apart) with or without food until chemotherapy regimen complete. .   ? Patient was educated to swallow tablet whole and not to crush, chew or cut tablets.   ? Avoid grapefruit.     Adherence  The patient's ability to self-administer medication was assessed.    Patient was educated on the importance of adherence and that the consequences of non-adherence could include disease progression. The patient's ability to be adherent with drug therapies was discussed and the patient was provided options for tools/resources that promote adherence to therapy.    How to Store Medication  Evangela Heffler was educated to store Olaparib [name]) at room temperature in a safe place away from humidity, pets, and children.  I recommended storing the medication in a pill box, if needed as long as Olaparib [name]) was protected from moisture.  I recommended if family members would be handling the medication, they should use gloves.  Additionally, I recommended cleaning any surfaces touched by Olaparib [name]) with bleach, if possible.     How to Manage Missed Doses  I instructed the patient that if a dose is missed or vomited up, she should not take an extra dose to make it up. Instead, resume the medication at the next scheduled dosing time.     Contraindications / Safety Precautions / Adverse Effects  Contraindications to therapy, safety precautions, and common adverse effects (>30% incidence, listed below) were discussed with the patient.  I explained that most patients do NOT experience all these side effects and that this list was not inclusive.  I instructed patient to report any adverse effects to their doctor, pharmacist or nurse.   ? Fatigue, weakness  ? Nausea, abdominal pain, vomiting, diarrhea  ? Decreased blood counts  ? Upper respiratory infections (sinus infections, cough, sore throat)  ? Muscle pain  ? Increased serum creatinine    Patient was instructed to call and seek help immediately if she had:   ? Signs of an infection such as fever, chills, cough, pain or burning when you urinate.   ? Signs of bleeding problems such as black tarry stools; blood in urine; pinpoint red or purple spots on skin.   ? Signs of VTE (shortness of breath, chest tightness)  ? Signs of a hypersensitivity reaction    Vaccination Status Education   Appropriate recommended vaccinations were reviewed and discussed with the patient. The patient was also reminded about the importance of receiving an annual influenza vaccine as indicated.    Drug-Drug Interactions  A medication history and reconciliation was performed (including prescription medications, supplements, over the counter medications, and herbal products). The medication list was updated and the patient?s current medication list is included below.  I stressed the importance of maintaining an accurate medication list and informing their medical team prior to taking any new medications.     Home Medications    Medication Sig   acetaminophen (ACETAMINOPHEN EXTRA STRENGTH) 500 mg tablet Take one  tablet to two tablets by mouth every 6 hours as needed for Pain. Max of 4,000 mg of acetaminophen in 24 hours.   ALPRAZolam (XANAX) 1 mg tablet Take 1 mg by mouth daily as needed for Anxiety.   amphetamine-dextroamphetamine XR (ADDERALL XR) 30 mg capsule Take 30 mg by mouth twice daily.   diphenhydrAMINE hcl (BENADRYL ALLERGY) 25 mg tablet Take 25-50 mg by mouth at bedtime as needed.   estradioL (ESTRACE) 0.5 mg tablet Take one tablet by mouth daily.   gabapentin (NEURONTIN) 100 mg capsule Take one capsule by mouth three times daily as needed. Indications: neuropathic pain   ibuprofen (MOTRIN) 800 mg tablet Take one tablet by mouth every 8 hours as needed. Take with food.   lidocaine/prilocaine (EMLA) 2.5/2.5 % topical cream Apply  topically to affected area as Needed. Apply 30-60 minutes prior to port access.   melatonin 5 mg chew Chew 10 mg by mouth nightly as needed (sleep).   MULTIVITAMIN PO Take 1 tablet by mouth daily.   ondansetron HCL (ZOFRAN) 8 mg tablet Take 1 tablet by mouth twice a day 30 minutes prior to each Olaparib dose.  Indications: prevent nausea and vomiting from cancer chemotherapy   pantoprazole DR (PROTONIX) 40 mg tablet Take 40 mg by mouth at bedtime daily.   polyethylene glycol 3350 (MIRALAX) 17 gram/dose powder Take seventeen g by mouth daily.  Patient taking differently: Take 17 g by mouth daily as needed for Constipation.   prochlorperazine maleate (COMPAZINE) 10 mg tablet Take one tablet by mouth every 6 hours as needed for Nausea or Vomiting.   propranoloL (INDERAL) 10 mg tablet Take one tablet by mouth every 6 hours as needed. 1 every 6 hours as needed for tremor   traMADoL (ULTRAM) 50 mg tablet Take 50 mg by mouth every 8 hours as needed for Pain.       Drug-Drug Interactions (DDIs)  DDIs were evaluated: No significant drug-drug interactions were identified.     Drug-Food Interactions  Drug-food interactions were evaluated: Olaparib [name]) can be administered with or without food.  Avoid grapefruit or se[place] oranges.     REMS Program  No REMS is required for this medication.    Reproductive Concerns  Reproductive concerns were reviewed with the patient. As patient is a female not of child-bearing potential, education was not applicable.     What to Do With Any Unused or Expired Medications  Appropriate safe handling and disposal procedures were reviewed with the patient. [name] was instructed to return any unused or expired oral chemotherapy medication to a designated disposal bin at one of the [facility] locations or to utilize a community drug take back program. Instructed not to flush down the toilet or to crush and dispose of medication in the trash.    Monitoring  Monitoring and follow-up plan was discussed with patient. Arrica Saidi was instructed to contact the oral chemotherapy pharmacist at [phone] if they have any questions or concerns regarding their medication therapy.  Informed the patient that we would send the prescription to a specialty pharmacy and that the pharmacy would be calling the patient to schedule a shipment.  Emphasized if their phone calls were not answered, the specialty pharmacy would not ship the medication.    This medication is considered medium risk per our internal oral chemotherapy risk categorization and the patient will be contacted for education, toxicity check at 2 weeks, one reassessment at 3 months and then annually, if applicable (  medium risk monitoring).       Questions  Patient was given the opportunity to ask questions. Patient verbalized understanding, agreed with the plan and had no questions or concerns regarding therapy.     Allena Napoleon, PHARMD, BCOP  Oncology Clinical Pharmacist  02/14/21

## 2021-02-14 NOTE — Telephone Encounter
Called patient to review plan and determine lab closer to home for her to obtain weekly labs after starting olaparib. Patient prefers to use [facility] for her outpatient labs, states they do these as a walk in. Orders faxed and confirmation received for weekly labs x5 weeks.     Patient needs to have labs drawn prior to starting olaparib due to borderline low ANC. Patient will call our office when she receives her medication to set up a 4 week follow up appointment. Patient had no further questions or concerns at this time.

## 2021-02-14 NOTE — Progress Notes
Oral Chemotherapy: Pharmacist Initial Assessment  Olaparib [name])    Indication/Regimen  Kristina Velazquez is a 31 y.o. female with a diagnosis of ovarian cancer and a BRCA1 mutation and is planned to be treated with Olaparib [name]).  The dosing regimen of 300 mg (two 150-mg tablets) by mouth twice daily is appropriate for [employer]. It is planned to continue until chemotherapy regimen complete.  The patient's ability to self-administer their medication will be assessed during patient education.    Therapy Goals Assessment and Treatment Regimens  Therapy goal(s) may be referenced in the [facility] treatment plan properties    Active treatment regimens:    Active Treatment Plans for Demeter, Cecille Aver     ONCOLOGY 1: OP GYN OLAPARIB     Current day: Day 1, Cycle 1  (Planned for 02/23/2021)              Past treatment regimens:    Past Treatment Plans    ONCOLOGY 1   Plan Name Cycles Start Date Discontinue Date Discontinue Reason Discontinue User    OP GYN CARBOPLATIN (desensitization) + PACLITAXEL (desensitization) (OVARIAN) 6 of 6 cycles started 09/04/2020  02/08/2021 Therapy Complete Eileen Stanford, MD           Therapy and Dose Appropriateness Assessment  Relevant molecular/cytology studies: germline BRCA1 mutation     Wt Readings from Last 1 Encounters:   02/09/21 96.8 kg (213 lb 6.4 oz)     Estimated body surface area is 2.16 meters squared as calculated from the following:    Height as of 02/09/21: 173.8 cm (5' 8.43).    Weight as of 02/09/21: 96.8 kg (213 lb 6.4 oz).    Baseline Labs  CBC w/Diff    Lab Results   Component Value Date/Time    WBC 3.8 (L) 02/08/2021 10:23 AM    RBC 4.14 02/08/2021 10:23 AM    HGB 12.3 02/08/2021 10:23 AM    HCT 35.9 (L) 02/08/2021 10:23 AM    MCV 86.6 02/08/2021 10:23 AM    MCH 29.7 02/08/2021 10:23 AM    MCHC 34.3 02/08/2021 10:23 AM    RDW 17.0 (H) 02/08/2021 10:23 AM    PLTCT 303 02/08/2021 10:23 AM    MPV 6.7 (L) 02/08/2021 10:23 AM    Lab Results Component Value Date/Time    NEUT 36 (L) 02/08/2021 10:23 AM    ANC 1.37 (L) 02/08/2021 10:23 AM    LYMA 38 02/08/2021 10:23 AM    ALC 1.48 02/08/2021 10:23 AM    MONA 24 (H) 02/08/2021 10:23 AM    AMC 0.92 (H) 02/08/2021 10:23 AM    EOSA 1 02/08/2021 10:23 AM    AEC 0.05 02/08/2021 10:23 AM    BASA 1 02/08/2021 10:23 AM    ABC 0.02 02/08/2021 10:23 AM        Comprehensive Metabolic Profile    Lab Results   Component Value Date/Time    NA 138 02/08/2021 10:23 AM    K 3.9 02/08/2021 10:23 AM    CL 104 02/08/2021 10:23 AM    CO2 25 02/08/2021 10:23 AM    GAP 9 02/08/2021 10:23 AM    BUN 9 02/08/2021 10:23 AM    CR 0.76 02/08/2021 10:23 AM    GLU 110 (H) 02/08/2021 10:23 AM    Lab Results   Component Value Date/Time    CA 9.7 02/08/2021 10:23 AM    PO4 3.7 11/17/2020 04:09 AM    ALBUMIN 4.2  02/08/2021 10:23 AM    TOTPROT 7.1 02/08/2021 10:23 AM    ALKPHOS 58 02/08/2021 10:23 AM    AST 23 02/08/2021 10:23 AM    ALT 35 02/08/2021 10:23 AM    TOTBILI 0.5 02/08/2021 10:23 AM      Estimated Creatinine Clearance: 132.8 mL/min (based on SCr of 0.76 mg/dL).    Safety Assessment  The following safety precautions to this therapy were reviewed:  ? Bone marrow suppression  ? GI toxicity (N/V)  ? Hypersensitivity  ? Pulmonary toxicity (pneumonitis has occurred)  ? Secondary malignancy (MDS/AML)    Contraindication and/or Safety precautions for this medication have been reviewed:  ? No concerns have been identified.   ? Known hypersensitivity to olaparib, or any component of the formulation is a labeled contraindication to this drug.    Reproductive Risk  ? The patient's pregnancy status was assessed. As patient is a female not of child-bearing potential, education was not applicable.     Risk Evaluation and Mitigation Strategy (REMS)  ? No REMS is required for this medication.    Medication Reconciliation and Drug/Food Interaction Assessment  A comprehensive medication history and reconciliation was performed. The medication list was updated, and the patient's current medication list is included below. The patient was instructed to speak with her health care provider and/or the oral chemotherapy pharmacist before starting any new drug, including prescription or over the counter, natural / herbal products, or vitamins.      Home Medications    Medication Sig   acetaminophen (ACETAMINOPHEN EXTRA STRENGTH) 500 mg tablet Take one tablet to two tablets by mouth every 6 hours as needed for Pain. Max of 4,000 mg of acetaminophen in 24 hours.   ALPRAZolam (XANAX) 1 mg tablet Take 1 mg by mouth daily as needed for Anxiety.   amphetamine-dextroamphetamine XR (ADDERALL XR) 30 mg capsule Take 30 mg by mouth twice daily.   diphenhydrAMINE hcl (BENADRYL ALLERGY) 25 mg tablet Take 25-50 mg by mouth at bedtime as needed.   estradioL (ESTRACE) 0.5 mg tablet Take one tablet by mouth daily.   gabapentin (NEURONTIN) 100 mg capsule Take one capsule by mouth three times daily as needed. Indications: neuropathic pain   ibuprofen (MOTRIN) 800 mg tablet Take one tablet by mouth every 8 hours as needed. Take with food.   lidocaine/prilocaine (EMLA) 2.5/2.5 % topical cream Apply  topically to affected area as Needed. Apply 30-60 minutes prior to port access.   melatonin 5 mg chew Chew 10 mg by mouth nightly as needed (sleep).   MULTIVITAMIN PO Take 1 tablet by mouth daily.   ondansetron HCL (ZOFRAN) 8 mg tablet Take 1 tablet by mouth twice a day 30 minutes prior to each Olaparib dose.  Indications: prevent nausea and vomiting from cancer chemotherapy   pantoprazole DR (PROTONIX) 40 mg tablet Take 40 mg by mouth at bedtime daily.   polyethylene glycol 3350 (MIRALAX) 17 gram/dose powder Take seventeen g by mouth daily.  Patient taking differently: Take 17 g by mouth daily as needed for Constipation.   prochlorperazine maleate (COMPAZINE) 10 mg tablet Take one tablet by mouth every 6 hours as needed for Nausea or Vomiting.   propranoloL (INDERAL) 10 mg tablet Take one tablet by mouth every 6 hours as needed. 1 every 6 hours as needed for tremor   traMADoL (ULTRAM) 50 mg tablet Take 50 mg by mouth every 8 hours as needed for Pain.     Allergies   Allergen Reactions   ?  Carboplatin CHEST TIGHTNESS, FLUSHING (SKIN), HEADACHE, RASH, SHORTNESS OF BREATH, SEE COMMENTS and REDNESS     Did not complete the remainder of the infusion.  Additional reaction 01/24/21 during bag A, rash located neck, chest and back of ears, dyspnea, hypertension with BP 140/94, tachycardia with HR 138 and chest tightness.  Completed the remainder of the infusion.   ? Hydrocodone STOMACH UPSET and VOMITING   ? Paclitaxel CHEST TIGHTNESS, VISION CHANGES and REDNESS   ? Peanut RASH     ? No significant drug-drug interactions were identified.   ? Drug-food interactions were evaluated: Olaparib may be administered with or without food.  ? Avoid grapefruit, grapefruit juice, [place]ville oranges, or [place]ville orange juice while taking this drug.    Past Medical History and Comorbidities   Patient Active Problem List   Diagnosis   ? Ovarian cancer (HCC)   ? Altered taste   ? Cancer related pain   ? Chemotherapy adverse reaction, sequela   ? Peritoneal carcinomatosis (HCC)   ? Postoperative state     Immunization Assessment   Immunization History   Administered Date(s) Administered   ? COVID-19 (MODERNA), mRNA vacc, 100 mcg/0.5 mL (PF) 02/13/2019, 03/13/2019     Vaccine history and recommended vaccinations were reviewed and will be recommended as appropriate. The patient will be reminded about the importance of receiving an annual influenza vaccine as indicated.    Initial therapy assessment has been completed and the patient will be contacted to complete education on their regimen.     Allena Napoleon, PHARMD, BCOP  Clinical Oncology Pharmacist  02/14/21

## 2021-02-15 ENCOUNTER — Encounter: Admit: 2021-02-15 | Discharge: 2021-02-15 | Payer: 59

## 2021-02-21 ENCOUNTER — Encounter: Admit: 2021-02-21 | Discharge: 2021-02-21 | Payer: 59

## 2021-02-21 NOTE — Progress Notes
The Prior Authorization for Kristina Velazquez has been submitted for Kristina Velazquez via Cover My Meds.  Will continue to follow.    [place] Patient Advocate  [phone]

## 2021-02-22 ENCOUNTER — Encounter: Admit: 2021-02-22 | Discharge: 2021-02-22 | Payer: 59

## 2021-02-22 DIAGNOSIS — C569 Malignant neoplasm of unspecified ovary: Secondary | ICD-10-CM

## 2021-02-22 MED ORDER — OLAPARIB 150 MG PO TAB
300 mg | ORAL_TABLET | Freq: Two times a day (BID) | ORAL | 3 refills | Status: AC
Start: 2021-02-22 — End: ?

## 2021-02-22 NOTE — Progress Notes
Kristina Velazquez is unable to fill olaparib through the [facility] due to insurance requirements.  A prescription has been sent on 02/22/21 to [facility] (phone number: [phone]) so it can be filled.     Toni Arthurs, [facility]  Oncology Clinical Pharmacist  02/22/2021

## 2021-02-22 NOTE — Progress Notes
The Prior Authorization for Kristina Velazquez was approved for [name] from 02/21/2021 to 02/21/2022.     Kristina Velazquez's prescription for Kristina Velazquez is not able to be filled by [facility] because the patient's prescription insurance mandates them to fill at the insurance plan's preferred outside pharmacy.  Our pharmacy will work with clinic so that their prescription is sent to [facility] [phone]) as required by their prescription insurance coverage.    [place] Patient Advocate  [phone]

## 2021-02-28 ENCOUNTER — Encounter: Admit: 2021-02-28 | Discharge: 2021-02-28 | Payer: 59

## 2021-02-28 DIAGNOSIS — C569 Malignant neoplasm of unspecified ovary: Secondary | ICD-10-CM

## 2021-02-28 NOTE — Telephone Encounter
Lab results dated 02/27/21 received and sent to provider to review.  Also sent to be scanned into chart.

## 2021-03-05 ENCOUNTER — Encounter: Admit: 2021-03-05 | Discharge: 2021-03-05 | Payer: 59

## 2021-03-05 MED ORDER — ESTRADIOL 0.5 MG PO TAB
1 mg | ORAL_TABLET | Freq: Every day | ORAL | 3 refills | 30.00000 days | Status: AC
Start: 2021-03-05 — End: ?

## 2021-03-14 ENCOUNTER — Encounter: Admit: 2021-03-14 | Discharge: 2021-03-14 | Payer: 59

## 2021-03-16 ENCOUNTER — Encounter: Admit: 2021-03-16 | Discharge: 2021-03-16 | Payer: 59

## 2021-03-16 DIAGNOSIS — C569 Malignant neoplasm of unspecified ovary: Secondary | ICD-10-CM

## 2021-03-16 NOTE — Telephone Encounter
Received incoming fax of most recent lab results on patient. Sent to Dr. Martin Majestic and team for review. Copy of results sent to medical records to be scanned to patient's chart.

## 2021-03-19 ENCOUNTER — Encounter: Admit: 2021-03-19 | Discharge: 2021-03-19 | Payer: 59

## 2021-03-19 DIAGNOSIS — C569 Malignant neoplasm of unspecified ovary: Secondary | ICD-10-CM

## 2021-03-19 MED ORDER — RXAMB DIPH/LIDO/ANTACID 1:1:1 SUSPENSION (COMPOUND)
5 mL | ORAL | 0 refills | Status: AC | PRN
Start: 2021-03-19 — End: ?

## 2021-03-19 NOTE — Progress Notes
Oral Chemotherapy Pharmacist Cycle 1 Toxicity Check    Summary of Therapy   Kristina Velazquez continues olaparib for the maintenance treatment of ovarian cancer.  Cycle 1 start date was 03/03/2021. Kristina Velazquez confirms she is taking the medication as prescribed - 300 mg (two of the 150 mg tablets) by mouth twice daily.     Adverse Effects and Adherence Assessment   Kristina Velazquez is having the following adverse effects: mouth sores on her tongue (causing pain with eating/drinking, tried baking soda/saltwater rinses but it burned her mouth so she stopped, will prescribe magic mouthwash per discussion with Dr. [name]), nausea (worse the first week after starting olaparib but now significantly improved, continues to schedule ondansetron 8 mg prior to each dose of olaparib, denies vomiting), constipation (improved with taking docusate as needed), fatigue (ranks as a 6-7 out of 10, she is still able to work and do her normal daily activities).   None of the adverse effects were severe enough to interfere with adherence.     The patient's ability to self-administer medication was re-assessed. The patient has the ability to self-administer this medication. . Kristina Velazquez reports missing 2 doses since the start of therapy. she was re-educated on importance of adherence.    Dose Appropriateness Assessment  No renal or hepatic dose adjustment is required at this time.  No dose adjustments based on toxicity are required at this time. Treatment will continue until progression or unacceptable toxicity.      CBC w/Diff    Lab Results   Component Value Date/Time    WBC 3.8 (L) 02/08/2021 10:23 AM    RBC 4.14 02/08/2021 10:23 AM    HGB 12.3 02/08/2021 10:23 AM    HCT 35.9 (L) 02/08/2021 10:23 AM    MCV 86.6 02/08/2021 10:23 AM    MCH 29.7 02/08/2021 10:23 AM    MCHC 34.3 02/08/2021 10:23 AM    RDW 17.0 (H) 02/08/2021 10:23 AM    PLTCT 303 02/08/2021 10:23 AM    MPV 6.7 (L) 02/08/2021 10:23 AM    Lab Results   Component Value Date/Time NEUT 36 (L) 02/08/2021 10:23 AM    ANC 1.37 (L) 02/08/2021 10:23 AM    LYMA 38 02/08/2021 10:23 AM    ALC 1.48 02/08/2021 10:23 AM    MONA 24 (H) 02/08/2021 10:23 AM    AMC 0.92 (H) 02/08/2021 10:23 AM    EOSA 1 02/08/2021 10:23 AM    AEC 0.05 02/08/2021 10:23 AM    BASA 1 02/08/2021 10:23 AM    ABC 0.02 02/08/2021 10:23 AM        Comprehensive Metabolic Profile    Lab Results   Component Value Date/Time    NA 138 02/08/2021 10:23 AM    K 3.9 02/08/2021 10:23 AM    CL 104 02/08/2021 10:23 AM    CO2 25 02/08/2021 10:23 AM    GAP 9 02/08/2021 10:23 AM    BUN 9 02/08/2021 10:23 AM    CR 0.76 02/08/2021 10:23 AM    GLU 110 (H) 02/08/2021 10:23 AM    Lab Results   Component Value Date/Time    CA 9.7 02/08/2021 10:23 AM    PO4 3.7 11/17/2020 04:09 AM    ALBUMIN 4.2 02/08/2021 10:23 AM    TOTPROT 7.1 02/08/2021 10:23 AM    ALKPHOS 58 02/08/2021 10:23 AM    AST 23 02/08/2021 10:23 AM    ALT 35 02/08/2021 10:23 AM    TOTBILI 0.5 02/08/2021  10:23 AM            Creatinine clearance cannot be calculated (Patient's most recent lab result is older than the maximum 30 days allowed.)    Medication Reconciliation and Drug/Food Interaction Assessment  Kristina Velazquez reports the following medication changes since her last medication history; started taking docusate as needed for constipation. she was instructed to speak with her healthcare provider and/or the oral chemotherapy pharmacist before starting any new drug, including, but not limited to, prescription, over the counter, natural / herbal products, or vitamins and supplements.     Drug-drug and drug-food interactions were assessed between the patients? specialty medication(s) and her most current medication list. No significant drug-drug interactions were identified.     Allergies   Allergen Reactions   ? Carboplatin CHEST TIGHTNESS, FLUSHING (SKIN), HEADACHE, RASH, SHORTNESS OF BREATH, SEE COMMENTS and REDNESS     Did not complete the remainder of the infusion.  Additional reaction 01/24/21 during bag A, rash located neck, chest and back of ears, dyspnea, hypertension with BP 140/94, tachycardia with HR 138 and chest tightness.  Completed the remainder of the infusion.   ? Hydrocodone STOMACH UPSET and VOMITING   ? Paclitaxel CHEST TIGHTNESS, VISION CHANGES and REDNESS   ? Peanut RASH       Immunization Assessment   Immunization History   Administered Date(s) Administered   ? COVID-19 (MODERNA), mRNA vacc, 100 mcg/0.5 mL (PF) 02/13/2019, 03/13/2019       Vaccine history and recommended vaccinations were reviewed and will be recommended as appropriate. The patient will be reminded about the importance of receiving an annual influenza vaccine as indicated.    Safety Assessment  Risk Evaluation and Mitigation Strategy (REMS)  No REMS is required for this medication.    Reproductive Risk  Kristina Velazquez is a 31 y.o. female. As patient is a female not of child-bearing potential, education was not applicable.     Handling and Disposal  Appropriate safe handling and disposal procedures were reviewed with the patient. Kristina Velazquez was instructed to return any unused or expired oral chemotherapy medication to a designated disposal bin at one of the [facility] locations or to utilize a community drug take back program. Instructed not to flush down the toilet or to crush the medication    Follow-up Plan   Kristina Velazquez was encouraged to call the oral chemotherapy pharmacist at [phone] with questions. This medication is considered medium risk per our internal oral chemotherapy risk categorization. This re-assessment has been completed and the next reassessment planned for 3-4 months.    Claud Kelp, [facility]  Oncology Clinical Pharmacist  03/19/2021

## 2021-03-26 NOTE — Progress Notes
Subjective     GYNECOLOGIC ONCOLOGY EVALUATION    Name:Kristina Velazquez    Date: 03/27/2021    Referring & Primary Care Physician: Jerene Dilling    Chief Complaint:   Chief Complaint   Patient presents with   ? Heme/Onc Care     History of Present Illness:    Kristina Velazquez is a 31 y.o. female with below history     Onc Timeline Overview Note   Kristina Velazquez is a 31 y.o. female patient of Dr. 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 Here today after starting Olaparib 300mg  BID on 03/03/21.    Today she reports she is feeling okay. Notable fatigue that waxes and wanes. Able to continue working full time, but has to limit amount of exams performed. Compliant with Olaparib 300mg  BID.Takes 8mg  Zofran 30 min prior to Olaparib for nausea.  Total of 3 missed doses since starting on 03/03/21. Reports increased fatigue and nausea the first two weeks after starting Olaparib. This week appetite is improved. Constipation improving with fiber and increasing water intake. Complains of painful hemorrhoids. Thinks she had a GI illness last week that caused her abdominal discomfort, nausea, and dizziness. Symptoms have improved this week. 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 Intermittent hot flashes improved with estradiol 1mg  daily.      Denies vaginal bleeding, vaginal discharge,  fever, chills, vomiting, changes in bladder function, LE edema, chest pain, cough, or shortness of breath.     Ovarian cancer (HCC)   08/08/2020 Imaging    CT scan A/P demonstrated: Ill-defined complex pelvic mass with omental and mesenteric masses with intra-abdominal ascites suggesting a neoplastic process with metastatic disease. No other intraparenchymal metastases    CA125 = 862     08/16/2020 Biopsy    High grade serous carcinoma. ER 85%, PR 10%, positive PAX8, WT-1, p53     09/04/2020 - 01/24/2021 Chemotherapy    Had reaction to both taxol and carbo in C1, plan for inpatient C2  OP GYN CARBOPLATIN (desensitization) + PACLITAXEL (desensitization) (OVARIAN)  Plan Provider: Eileen Stanford, MD  Treatment goal: Curative   Line of treatment: First Line     10/10/2020 Pertinent Labs    Caris:  Genomic LOH high  BRCA negative  MSI stable  CCNE1 not amplified  Her 2 neg    Pt would be candidate for PARP maintenance       10/25/2020 Imaging    CT CAP: no disease in chest. Decrease in size of the bilateral adnexal masses, peritoneal metastases, and malignant ascites suggesting favorable response to therapy by the patient's metastatic ovarian cancer.      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  Interval hysterectomy, omentectomy and tumor debulking. Minimal residual areas of peritoneal thickening and nodularity, indeterminate and may be postoperative scarring or from minute residual/recurrent tumor. No   ascites.  2. No new abdominopelvic lymphadenopathy.      02/23/2021 -  Chemotherapy    OP GYN OLAPARIB  Plan Provider: Eileen Stanford, MD  Treatment goal: Control  Line of treatment: Maintenance       Reproductive History  Menstrual Hx               LMP: 07/22/20               Having Periods:  Yes               Age at first period: 76  ?  Pregnancy Hx               Number of pregnancies: None               Number of live births:                Age of first live birth:                Did you breastfeed: No If Yes, how long?                Oral Birth Control:  Yes               Years: 8-9 years               Infertility Medication:  No               Year/Med Name:   ?  Menopausal Hx               Age of last period: N/A               Hormone Replacement Therapy:                 Years:   ?  Health Maintenence  Last Pap: 2021  Abn History of Pap: Denies  Colonoscopy: Denies  Mammogram: Denies  Bone scan: Denies  ?  Advanced Directives   DPOA: No  Living Will: No  ?  Past Medical History:  Medical History:   Diagnosis Date   ? COVID 01/29/2020   ? Inappropriate sinus tachycardia    ? Ovarian cancer [facility])      Past Surgical History:  Surgical History:   Procedure Laterality Date   ? HX SHOULDER SURGERY Right 2008   ? HX WISDOM TEETH EXTRACTION  2014   ? placement of port-a-cath with fluoroscopy Left 08/25/2020    Performed by Freund, Alecia Lemming., MD at [facility] OR   ? FLUOROSCOPIC GUIDANCE CENTRAL VENOUS ACCESS DEVICE PLACEMENT/ REPLACEMENT/ REMOVAL Left 08/25/2020    Performed by Freund, Alecia Lemming., MD at [facility] OR   ? ESOPHAGOGASTRODUODENOSCOPY WITH SPECIMEN COLLECTION BY BRUSHING/ WASHING N/A 11/13/2020    Performed by Comer Locket, MD at [facility] ENDO   ? COLONOSCOPY DIAGNOSTIC WITH SPECIMEN COLLECTION BY BRUSHING/ WASHING - FLEXIBLE N/A 11/13/2020    Performed by Comer Locket, MD at [facility] ENDO   ? TOTAL ABDOMINAL HYSTERECTOMY AND BILATERAL SALPINGO-OOPHORECTOMY WITH OMENTECTOMY AND RADICAL RESECTION FOR DEBULKING, APPENDECTOMY, Bilateral 11/14/2020    Performed by Eileen Stanford, MD at [facility] OR   ? Cytoreductive surgery; largest tumor approx. 6 cm Hyperthermic Intraperitoneal chemotherapy  N/A 11/14/2020    Performed by Dia Crawford, MD at [facility] OR   ? HX CHOLECYSTECTOMY       Medications:    Current Outpatient Medications:   ?  acetaminophen (ACETAMINOPHEN EXTRA STRENGTH) 500 mg tablet, Take one tablet to two tablets by mouth every 6 hours as needed for Pain. Max of 4,000 mg of acetaminophen in 24 hours., Disp: 60 tablet, Rfl: 1  ?  ALPRAZolam (XANAX) 1 mg tablet, Take one tablet by mouth daily as needed for Anxiety., Disp: , Rfl:   ?  amphetamine-dextroamphetamine XR (ADDERALL XR) 30 mg capsule, Take one capsule by mouth twice daily., Disp: , Rfl:   ?  DIPH/LIDO/ANTACID 1:1:1 ORAL SUSPENSION (COMPOUND), Swish and Spit 5 mL by mouth as directed every 4 hours as needed., Disp: 420 mL, Rfl: 0  ?  diphenhydrAMINE hcl (BENADRYL ALLERGY) 25 mg tablet, Take one tablet to two tablets by mouth at bedtime as needed., Disp: , Rfl:   ?  docusate (COLACE) 100 mg capsule, Take one capsule by mouth as Needed for Constipation. Indications: constipation, Disp: , Rfl:   ?  estradioL (ESTRACE) 0.5 mg tablet, Take two tablets by mouth daily., Disp: 60 tablet, Rfl: 3  ?  gabapentin (NEURONTIN) 100 mg capsule, Take one capsule by mouth three times daily as needed. Indications: neuropathic pain, Disp: 90 capsule, Rfl: 1  ?  ibuprofen (MOTRIN) 800 mg tablet, Take one tablet by mouth every 8 hours as needed. Take with food., Disp: 60 tablet, Rfl: 1  ?  lidocaine/prilocaine (EMLA) 2.5/2.5 % topical cream, Apply  topically to affected area as Needed. Apply 30-60 minutes prior to port access., Disp: 30 g, Rfl: 0  ?  melatonin 5 mg chew, Chew 10 mg by mouth nightly as needed (sleep)., Disp: , Rfl:   ?  MULTIVITAMIN PO, Take 1 tablet by mouth daily., Disp: , Rfl:   ?  olaparib (LYNPARZA) 150 mg tablet, Take two tablets by mouth twice daily., Disp: 120 tablet, Rfl: 3  ?  ondansetron HCL (ZOFRAN) 8 mg tablet, Take 1 tablet by mouth twice a day 30 minutes prior to each Olaparib dose.  Indications: prevent nausea and vomiting from cancer chemotherapy, Disp: 60 tablet, Rfl: 3  ?  pantoprazole DR (PROTONIX) 40 mg tablet, Take one tablet by mouth at bedtime daily., Disp: , Rfl:   ?  polyethylene glycol 3350 (MIRALAX) 17 gram/dose powder, Take seventeen g by mouth daily. (Patient taking differently: Take seventeen g by mouth daily as needed for Constipation.), Disp: 510 g, Rfl: 1  ?  prochlorperazine maleate (COMPAZINE) 10 mg tablet, Take one tablet by mouth every 6 hours as needed for Nausea or Vomiting., Disp: 30 tablet, Rfl: 3  ?  propranoloL (INDERAL) 10 mg tablet, Take one tablet by mouth every 6 hours as needed. 1 every 6 hours as needed for tremor, Disp: 90 tablet, Rfl: 3  ?  traMADoL (ULTRAM) 50 mg tablet, Take 50 mg by mouth every 8 hours as needed for Pain., Disp: , Rfl:     Allergies:  Allergies   Allergen Reactions   ? Carboplatin CHEST TIGHTNESS, FLUSHING (SKIN), HEADACHE, RASH, SHORTNESS OF BREATH, SEE COMMENTS and REDNESS     Did not complete the remainder of the infusion.  Additional reaction 01/24/21 during bag A, rash located neck, chest and back of ears, dyspnea, hypertension with BP 140/94, tachycardia with HR 138 and chest tightness.  Completed the remainder of the infusion.   ? Hydrocodone STOMACH UPSET and VOMITING   ? Paclitaxel CHEST TIGHTNESS, VISION CHANGES and REDNESS   ? Peanut RASH     Social History:  Social History     Socioeconomic History   ?  Marital status: Married   Tobacco Use   ? Smoking status: Never   ? Smokeless tobacco: Never   Vaping Use   ? Vaping Use: Never used   Substance and Sexual Activity   ? Alcohol use: Yes     Comment: OCASSIONALLY    ? Drug use: Never     Family History:  Family History   Problem Relation Age of Onset   ? Genetic Disorder Mother         BRCA1+   ? Cancer-Breast Mother 87   ? Cancer-Ovarian Mother 45   ? Heart Disease Mother    ? Depression Mother    ? Kidney Cancer Father 22   ? Hypertension Father    ? None Reported Sister    ? None Reported Brother    ? Unknown to Patient Paternal Uncle    ? Hypertension Maternal Grandmother    ? Arthritis-osteo Maternal Grandmother    ? Migraines Maternal Grandmother    ? Depression Maternal Grandmother    ? Heart Disease Maternal Grandfather    ? Hypertension Maternal Grandfather    ? Cancer-Breast Paternal Grandmother 38   ? Hypertension Paternal Grandfather    ? Stroke Paternal Grandfather      REVIEW OF SYSTEMS:        CONSTITUTIONAL: Negative unless stated in HPI  EYES: Negative unless stated in HPI  ENT: Negative unless stated in HPI  RESPIRATORY: Negative unless stated in HPI  CARDIOVASCULAR: Negative unless stated in HPI  GI: Negative unless stated in HPI  GU: Negative unless stated in HPI  MUSCULO-SKELETAL: Negative unless stated in HPI  SKIN: Negative unless stated in HPI  ENDOCRINE: Negative unless stated in HPI  HEMATOLOGIC: Negative unless stated in HPI    ECOG 1    Physical Exam:    LMP 08/17/2020   GENERAL APPEARANCE: Appears healthy.  Alert; in no acute distress.  Pleasant. Normal appearance.  HEENT: Unremarkable. Head is normocephalic and atraumatic. Conjunctivae normal. Normal range of motion. Wearing head wrap.   PULMONARY: Pulmonary effort is normal.   NEUROLOGIC: Oriented x 4.   SKIN: No obvious rashes or lesions within viewable area.   PSYCHIATRIC/BEHAVIORAL: Mood, affect, thought content, judgement, and behavior are appropriate.       CBC w/Diff    Lab Results   Component Value Date/Time    WBC 8.3 03/27/2021 01:57 PM    RBC 4.41 03/27/2021 01:57 PM    HGB 13.2 03/27/2021 01:57 PM    HCT 39.4 03/27/2021 01:57 PM    MCV 89.4 03/27/2021 01:57 PM    MCH 30.0 03/27/2021 01:57 PM    MCHC 33.6 03/27/2021 01:57 PM    RDW 17.7 (H) 03/27/2021 01:57 PM    PLTCT 329 03/27/2021 01:57 PM    MPV 6.8 (L) 03/27/2021 01:57 PM    Lab Results   Component Value Date/Time    NEUT 74 03/27/2021 01:57 PM    ANC 6.20 03/27/2021 01:57 PM    LYMA 17 (L) 03/27/2021 01:57 PM    ALC 1.40 03/27/2021 01:57 PM    MONA 7 03/27/2021 01:57 PM    AMC 0.60 03/27/2021 01:57 PM    EOSA 1 03/27/2021 01:57 PM    AEC 0.10 03/27/2021 01:57 PM    BASA 1 03/27/2021 01:57 PM    ABC 0.00 03/27/2021 01:57 PM          Comprehensive Metabolic Profile    Lab Results   Component Value Date/Time  NA 139 03/27/2021 01:57 PM    K 3.9 03/27/2021 01:57 PM    CL 105 03/27/2021 01:57 PM    CO2 29 03/27/2021 01:57 PM    GAP 5 03/27/2021 01:57 PM    BUN 11 03/27/2021 01:57 PM    CR 1.07 (H) 03/27/2021 01:57 PM    GLU 98 03/27/2021 01:57 PM    Lab Results   Component Value Date/Time    CA 9.3 03/27/2021 01:57 PM    PO4 3.7 11/17/2020 04:09 AM    ALBUMIN 4.4 03/27/2021 01:57 PM    TOTPROT 7.1 03/27/2021 01:57 PM    ALKPHOS 55 03/27/2021 01:57 PM    AST 16 03/27/2021 01:57 PM    ALT 22 03/27/2021 01:57 PM    TOTBILI 0.5 03/27/2021 01:57 PM          Other labs    No results found for: ESR No results found for: LDH     Lab Results   Component Value Date    CA125 10 03/27/2021          ASSESSMENT/PLAN:  Jermiah Basich is a 31 y.o. 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 Now on maintenance Olaparib 300mg  BID. Here for 1 month follow up.     ? Compliant with Olaparib 300mg  BID. Total of 3 missed doses since starting 03/03/21. Continue Zofran 8mg  30 min prior to Olaparib. Continue monthly CBC w/diff and CMP. External order placed as patient prefers labs at [facility] system.  ? CA125 is 10. Patient informed via MyChart.   ?  CBC and CMP stable.  ? Elevated serum creatinine -- Serum creatinine today 1.07. 1.1 on 2/16. Will continue to monitor closely due to renal toxicity. Consider adding Cystatin C for further evaluation if continued elevation. Encouraged increase water intake to 90oz per day.   ? Fatigue -- Continued fatigue while working work full time. Notable side effect of drug. Discussed continuing to observe closely and patient to update on symptoms in 1-2 weeks.   ? Abdominal discomfort -- Improving. Possible related to recent illness, likely gastroenteritis. Continue to observe as notable adverse effect of drug.   ? Vasomotor symptoms of menopause -- Improved. Continue Estradiol 1mg  daily.   ? Hemorrhoids -- Encouraged Miralax use to avoid constipation. Recommend OTC treatments with sitz baths, hydrocortisone suppositories/cream and witch hazel pads.   ? Discussed possible symptoms of cancer recurrence, such as cough, chest pain, early satiety, abdominal pain/bloating, N/V, vaginal bleeding/discharge or change in bowel/bladder habits, dizziness, HA.    ? Plan for q17months CA125. Imaging based on symptoms or elevation in CA125  ? RTC with CBC w/diff, CMP, CA125 in 3 months with Dr. [name], or sooner if new symptoms arise.                    Hermina Staggers, PA-C  Gynecologic Oncology    Total time spent date of service: Greater than 30 minutes, which was spent in preparation for patient visit, review of pertinent history, labs and imaging, coordination of patient care, education and counseling of patient. Discussed the patient's diagnosis, management, and reviewed goals.

## 2021-03-27 ENCOUNTER — Encounter: Admit: 2021-03-27 | Discharge: 2021-03-27 | Payer: 59

## 2021-03-27 DIAGNOSIS — K649 Unspecified hemorrhoids: Secondary | ICD-10-CM

## 2021-03-27 DIAGNOSIS — R232 Flushing: Secondary | ICD-10-CM

## 2021-03-27 DIAGNOSIS — N951 Menopausal and female climacteric states: Secondary | ICD-10-CM

## 2021-03-27 DIAGNOSIS — C569 Malignant neoplasm of unspecified ovary: Secondary | ICD-10-CM

## 2021-03-27 DIAGNOSIS — U071 COVID: Secondary | ICD-10-CM

## 2021-03-27 DIAGNOSIS — R7989 Other specified abnormal findings of blood chemistry: Secondary | ICD-10-CM

## 2021-03-27 DIAGNOSIS — R109 Unspecified abdominal pain: Secondary | ICD-10-CM

## 2021-03-27 DIAGNOSIS — Z5111 Encounter for antineoplastic chemotherapy: Secondary | ICD-10-CM

## 2021-03-27 DIAGNOSIS — R530 Neoplastic (malignant) related fatigue: Secondary | ICD-10-CM

## 2021-03-27 DIAGNOSIS — R000 Tachycardia, unspecified: Secondary | ICD-10-CM

## 2021-03-27 DIAGNOSIS — R112 Nausea with vomiting, unspecified: Secondary | ICD-10-CM

## 2021-03-27 LAB — CBC AND DIFF
HEMATOCRIT: 39 % — ABNORMAL HIGH (ref 36–45)
HEMOGLOBIN: 13 g/dL (ref 12.0–15.0)
MCV: 89 FL (ref 80–100)
RBC COUNT: 4.4 M/UL (ref 4.0–5.0)
WBC COUNT: 8.3 K/UL (ref 4.5–11.0)

## 2021-03-27 LAB — COMPREHENSIVE METABOLIC PANEL
POTASSIUM: 3.9 MMOL/L (ref 3.5–5.1)
SODIUM: 139 MMOL/L (ref 137–147)

## 2021-03-27 LAB — CA125: CA-125: 10 U/mL (ref <35)

## 2021-03-27 NOTE — Telephone Encounter
Left VM asking her to call office back to discuss changing her appointment today to a [place] visit due to power outage at clinic location.

## 2021-03-27 NOTE — Telephone Encounter
Called patient and LVM regarding changing appointment to [facility].  Clinic number given.

## 2021-03-29 ENCOUNTER — Encounter: Admit: 2021-03-29 | Discharge: 2021-03-29 | Payer: 59

## 2021-03-29 NOTE — Telephone Encounter
Faxed request for monthly labs to [facility] lab and received confirmation.

## 2021-04-05 ENCOUNTER — Encounter: Admit: 2021-04-05 | Discharge: 2021-04-05 | Payer: 59

## 2021-04-05 NOTE — Telephone Encounter
This RN faxed request for most recent records from [facility] after patient's recent trip to the ED (04/04/21). Fax confirmation received.

## 2021-04-10 ENCOUNTER — Encounter: Admit: 2021-04-10 | Discharge: 2021-04-10 | Payer: 59

## 2021-04-10 NOTE — Telephone Encounter
Received fax from [facility] that records request for recent ED visit was sent to them by mistake.  Medical Records request for ED visit on 04/04/21 sent to [place] with confirmation of fax sending received.

## 2021-04-27 ENCOUNTER — Encounter: Admit: 2021-04-27 | Discharge: 2021-04-27 | Payer: 59

## 2021-05-02 ENCOUNTER — Encounter: Admit: 2021-05-02 | Discharge: 2021-05-02 | Payer: 59

## 2021-05-02 DIAGNOSIS — Z5111 Encounter for antineoplastic chemotherapy: Secondary | ICD-10-CM

## 2021-05-02 DIAGNOSIS — C569 Malignant neoplasm of unspecified ovary: Secondary | ICD-10-CM

## 2021-05-15 ENCOUNTER — Encounter: Admit: 2021-05-15 | Discharge: 2021-05-15 | Payer: 59

## 2021-05-24 ENCOUNTER — Encounter: Admit: 2021-05-24 | Discharge: 2021-05-24 | Payer: 59

## 2021-05-25 ENCOUNTER — Encounter: Admit: 2021-05-25 | Discharge: 2021-05-25 | Payer: 59

## 2021-05-25 NOTE — Telephone Encounter
Received incoming fax from [facility] of patient's lab results drawn on 05/24/21. Will have Dr. Martin Majestic and team review. Sent to medical records to be scanned to patient's chart.

## 2021-05-28 ENCOUNTER — Encounter: Admit: 2021-05-28 | Discharge: 2021-05-28 | Payer: 59

## 2021-05-28 DIAGNOSIS — Z5111 Encounter for antineoplastic chemotherapy: Secondary | ICD-10-CM

## 2021-05-28 DIAGNOSIS — C569 Malignant neoplasm of unspecified ovary: Secondary | ICD-10-CM

## 2021-06-05 ENCOUNTER — Encounter: Admit: 2021-06-05 | Discharge: 2021-06-05 | Payer: 59

## 2021-06-05 NOTE — Telephone Encounter
Oral Chemotherapy Pharmacist Reassessment Note    Summary of Therapy  Verdelle Valtierra continues olaparib Garey Ham) for the maintenance treatment of ovarian cancer. Cycle 1 start date was 03/03/21. Ms. Tencza confirms she is taking the medication as prescribed - 300 mg (2 x 150 mg tablets) by mouth twice daily.     Adverse Effects and Adherence Assessment   Ms. Kinker is not experiencing any significant adverse effects to this medication regimen.  Reports that her fatigue has remained present, but is not bothersome enough to interfere with daily activities. Also notes that her nausea has improved significantly and that she no longer requires Zofran pre-medication prior to olaparib doses. Labs from her local laboratory have been stable, and patient appears to be tolerating olaparib well.     The patient's ability to self-administer medication was re-assessed. The patient has the ability to self-administer this medication.  Ms. Rotundo reports missing 1 dose in the past month of therapy.    Dose Appropriateness Assessment  No renal or hepatic dose adjustment is required at this time.  No dose adjustments based on toxicity are required at this time.  Treatment will continue until chemotherapy regimen complete.        CBC w diff    Lab Results   Component Value Date/Time    WBC 8.3 03/27/2021 01:57 PM    RBC 4.41 03/27/2021 01:57 PM    HGB 13.2 03/27/2021 01:57 PM    HCT 39.4 03/27/2021 01:57 PM    MCV 89.4 03/27/2021 01:57 PM    MCH 30.0 03/27/2021 01:57 PM    MCHC 33.6 03/27/2021 01:57 PM    RDW 17.7 (H) 03/27/2021 01:57 PM    PLTCT 329 03/27/2021 01:57 PM    MPV 6.8 (L) 03/27/2021 01:57 PM    Lab Results   Component Value Date/Time    NEUT 74 03/27/2021 01:57 PM    ANC 6.20 03/27/2021 01:57 PM    LYMA 17 (L) 03/27/2021 01:57 PM    ALC 1.40 03/27/2021 01:57 PM    MONA 7 03/27/2021 01:57 PM    AMC 0.60 03/27/2021 01:57 PM    EOSA 1 03/27/2021 01:57 PM    AEC 0.10 03/27/2021 01:57 PM    BASA 1 03/27/2021 01:57 PM ABC 0.00 03/27/2021 01:57 PM        Comprehensive Metabolic Profile    Lab Results   Component Value Date/Time    NA 139 03/27/2021 01:57 PM    K 3.9 03/27/2021 01:57 PM    CL 105 03/27/2021 01:57 PM    CO2 29 03/27/2021 01:57 PM    GAP 5 03/27/2021 01:57 PM    BUN 11 03/27/2021 01:57 PM    CR 1.07 (H) 03/27/2021 01:57 PM    GLU 98 03/27/2021 01:57 PM    Lab Results   Component Value Date/Time    CA 9.3 03/27/2021 01:57 PM    PO4 3.7 11/17/2020 04:09 AM    ALBUMIN 4.4 03/27/2021 01:57 PM    TOTPROT 7.1 03/27/2021 01:57 PM    ALKPHOS 55 03/27/2021 01:57 PM    AST 16 03/27/2021 01:57 PM    ALT 22 03/27/2021 01:57 PM    TOTBILI 0.5 03/27/2021 01:57 PM            Creatinine clearance cannot be calculated (Patient's most recent lab result is older than the maximum 30 days allowed.)    Medication Reconciliation and Drug/Food Interaction Assessment  A comprehensive medication reconciliation was performed for Ms. Hajduk.  her medication list was updated to reflect any identified changes and the patient?s current medication list is included below. Ms. Adcock was instructed to speak with her healthcare provider and/or the oral chemotherapy pharmacist before starting any new drug, including, but not limited to, prescription, over the counter, natural / herbal products, or vitamins and supplements.     Drug-drug and drug-food interactions were assessed between the patients? specialty medication(s) and her most current medication list. No significant drug-drug interactions were identified.     Home Medications    Medication Sig   acetaminophen (ACETAMINOPHEN EXTRA STRENGTH) 500 mg tablet Take one tablet to two tablets by mouth every 6 hours as needed for Pain. Max of 4,000 mg of acetaminophen in 24 hours.   ALPRAZolam (XANAX) 1 mg tablet Take one tablet by mouth daily as needed for Anxiety.   amphetamine-dextroamphetamine XR (ADDERALL XR) 30 mg capsule Take one capsule by mouth twice daily.   DIPH/LIDO/ANTACID 1:1:1 ORAL SUSPENSION (COMPOUND) Swish and Spit 5 mL by mouth as directed every 4 hours as needed.   diphenhydrAMINE hcl (BENADRYL ALLERGY) 25 mg tablet Take one tablet to two tablets by mouth at bedtime as needed.   docusate (COLACE) 100 mg capsule Take one capsule by mouth as Needed for Constipation. Indications: constipation   estradioL (ESTRACE) 0.5 mg tablet Take two tablets by mouth daily.   gabapentin (NEURONTIN) 100 mg capsule Take one capsule by mouth three times daily as needed. Indications: neuropathic pain   ibuprofen (MOTRIN) 800 mg tablet Take one tablet by mouth every 8 hours as needed. Take with food.   lidocaine/prilocaine (EMLA) 2.5/2.5 % topical cream Apply  topically to affected area as Needed. Apply 30-60 minutes prior to port access.   melatonin 5 mg chew Chew 10 mg by mouth nightly as needed (sleep).   MULTIVITAMIN PO Take 1 tablet by mouth daily.   olaparib (LYNPARZA) 150 mg tablet Take two tablets by mouth twice daily.   ondansetron HCL (ZOFRAN) 8 mg tablet Take 1 tablet by mouth twice a day 30 minutes prior to each Olaparib dose.  Indications: prevent nausea and vomiting from cancer chemotherapy   pantoprazole DR (PROTONIX) 40 mg tablet Take one tablet by mouth at bedtime daily.   polyethylene glycol 3350 (MIRALAX) 17 gram/dose powder Take seventeen g by mouth daily.  Patient taking differently: Take seventeen g by mouth daily as needed for Constipation.   prochlorperazine maleate (COMPAZINE) 10 mg tablet Take one tablet by mouth every 6 hours as needed for Nausea or Vomiting.   propranoloL (INDERAL) 10 mg tablet Take one tablet by mouth every 6 hours as needed. 1 every 6 hours as needed for tremor   traMADoL (ULTRAM) 50 mg tablet Take 50 mg by mouth every 8 hours as needed for Pain.     Allergies   Allergen Reactions   ? Carboplatin CHEST TIGHTNESS, FLUSHING (SKIN), HEADACHE, RASH, SHORTNESS OF BREATH, SEE COMMENTS and REDNESS     Did not complete the remainder of the infusion.  Additional reaction 01/24/21 during bag A, rash located neck, chest and back of ears, dyspnea, hypertension with BP 140/94, tachycardia with HR 138 and chest tightness.  Completed the remainder of the infusion.   ? Hydrocodone STOMACH UPSET and VOMITING   ? Paclitaxel CHEST TIGHTNESS, VISION CHANGES and REDNESS   ? Peanut RASH     Response to Therapy Assessment  The electronic medical record for Ms. Brent has been reviewed. The patient?s provider has determined this therapy is  appropriate to continue at this time. The patient will continue this therapy as she is achieving therapeutic benefit.     Immunization Assessment   Immunization History   Administered Date(s) Administered   ? COVID-19 (MODERNA), mRNA vacc, 100 mcg/0.5 mL (PF) 02/13/2019, 03/13/2019     Vaccine history and recommended vaccinations were reviewed and will be recommended as appropriate. The patient will be reminded about the importance of receiving an annual influenza vaccine as indicated.    Safety Assessment  Risk Evaluation and Mitigation Strategy (REMS)  No REMS is required for this medication.    Reproductive Risk (discussed at initial visit)  Ms. Scammon is a 31 y.o. female. As patient is a female not of child-bearing potential, education was not applicable. (Hysterectomy completed 10/2020)    Handling and Disposal (discussed at initial visit)  Appropriate safe handling and disposal procedures were reviewed with the patient. Ms. Stolar was instructed to return any unused or expired oral chemotherapy medication to a designated disposal bin at one of the [facility] locations or to utilize a community drug take back program. Instructed not to flush down the toilet or to crush the medication    Follow-up Plan   Demilade Showman was encouraged to call the oral chemotherapy pharmacist at [phone] with questions. This medication is considered medium risk per our internal oral chemotherapy risk categorization. This re-assessment has been completed and the next reassessment planned for 12 months.    Ginette Otto, [facility]  Oncology Clinical Pharmacist  06/05/2021

## 2021-06-13 ENCOUNTER — Encounter: Admit: 2021-06-13 | Discharge: 2021-06-13 | Payer: 59

## 2021-06-30 ENCOUNTER — Encounter: Admit: 2021-06-30 | Discharge: 2021-06-30 | Payer: 59

## 2021-06-30 MED ORDER — ESTRADIOL 0.5 MG PO TAB
1 mg | ORAL_TABLET | Freq: Every day | ORAL | 2 refills
Start: 2021-06-30 — End: ?

## 2021-07-06 ENCOUNTER — Encounter: Admit: 2021-07-06 | Discharge: 2021-07-06 | Payer: 59

## 2021-07-06 ENCOUNTER — Ambulatory Visit: Admit: 2021-07-06 | Discharge: 2021-07-06 | Payer: 59

## 2021-07-06 DIAGNOSIS — C569 Malignant neoplasm of unspecified ovary: Secondary | ICD-10-CM

## 2021-07-06 DIAGNOSIS — U071 COVID: Secondary | ICD-10-CM

## 2021-07-06 DIAGNOSIS — Z5111 Encounter for antineoplastic chemotherapy: Secondary | ICD-10-CM

## 2021-07-06 DIAGNOSIS — R000 Tachycardia, unspecified: Secondary | ICD-10-CM

## 2021-07-06 LAB — CBC AND DIFF
ABSOLUTE BASO COUNT: 0 K/UL (ref 0–0.20)
ABSOLUTE EOS COUNT: 0.1 K/UL (ref 0–0.45)
ABSOLUTE LYMPH COUNT: 1.2 K/UL (ref 1.0–4.8)
ABSOLUTE MONO COUNT: 0.5 K/UL (ref 0–0.80)
ABSOLUTE NEUTROPHIL: 5.5 K/UL (ref 1.8–7.0)
BASOPHILS %: 1 % (ref 0–2)
EOSINOPHILS %: 2 % (ref >60)
HEMATOCRIT: 43 % — ABNORMAL HIGH (ref 36–45)
MCH: 33 pg (ref 26–34)
MCV: 98 FL (ref <35)
MPV: 7.2 FL (ref 7–11)
PLATELET COUNT: 299 K/UL (ref 150–400)
RBC COUNT: 4.4 M/UL (ref 4.0–5.0)
RDW: 16 % — ABNORMAL HIGH (ref 11–15)
WBC COUNT: 7.5 K/UL (ref 4.5–11.0)

## 2021-07-06 LAB — COMPREHENSIVE METABOLIC PANEL
ALT: 12 U/L — ABNORMAL LOW (ref 7–56)
ANION GAP: 10 % (ref 3–12)
BLD UREA NITROGEN: 9 mg/dL (ref 7–25)
CO2: 25 MMOL/L (ref 21–30)
POTASSIUM: 4.3 MMOL/L (ref 3.5–5.1)
SODIUM: 140 MMOL/L (ref 137–147)
TOTAL BILIRUBIN: 1.2 mg/dL (ref 0.3–1.2)

## 2021-07-06 MED ORDER — LYNPARZA 150 MG PO TAB
ORAL_TABLET | 3 refills
Start: 2021-07-06 — End: ?

## 2021-07-09 ENCOUNTER — Encounter: Admit: 2021-07-09 | Discharge: 2021-07-09 | Payer: 59

## 2021-07-09 DIAGNOSIS — C569 Malignant neoplasm of unspecified ovary: Secondary | ICD-10-CM

## 2021-07-09 MED ORDER — OLAPARIB 150 MG PO TAB
300 mg | ORAL_TABLET | Freq: Two times a day (BID) | ORAL | 3 refills | Status: AC
Start: 2021-07-09 — End: ?

## 2021-07-09 NOTE — Oral Chemotherapy Note
Name:Kristina Velazquez           MRN: [id number]                 DOB:[birth year]          Age: 31 y.o.  Date of Service: 07/09/21      Oral Chemotherapy Refill Review     Patient Information:     Correct patient/DOB/Diagnosis: Yes    Reviewed most recent clinic notes for changes in plan: Yes    Most recent labs reviewed: Yes    Appointment Information:   Date of last appointment: 07/06/21    Follow up appointment scheduled : Yes 10/12/21     Medication Information:     Medication name: Olaparib [place])    Medication in [place]: Yes     Date of last refill released from [facility]: 02/23/21    Weight change >10%: No    Has a dose adjustment been made since last refill?No    Is Pharmacy listed in [place] treatment plan? Yes    Pharmacy: [facility] [phone], [address], [place]    Consent Date 02/09/21    Patient Contact  Method of communication: MyChart  Confirmed need for refill: Yes  Patient taking medication as directed Yes  Confirmation of receiving pharmacy: Yes  Patient is anticipating a start date of: continuation of therapy    Other Information: Need more cycles of Olaparib added to plan    Plan was routed to [place], APRN

## 2021-07-12 ENCOUNTER — Encounter: Admit: 2021-07-12 | Discharge: 2021-07-12 | Payer: 59

## 2021-07-12 DIAGNOSIS — C569 Malignant neoplasm of unspecified ovary: Secondary | ICD-10-CM

## 2021-07-12 DIAGNOSIS — Z1501 Genetic susceptibility to malignant neoplasm of breast: Secondary | ICD-10-CM

## 2021-07-30 ENCOUNTER — Encounter: Admit: 2021-07-30 | Discharge: 2021-07-30 | Payer: 59

## 2021-07-30 NOTE — Telephone Encounter
[facility] Breast Cancer Prevention/High Risk Intake Assessment    Patient Name:  Kristina Velazquez  MRN: [id number]  DOB: 02-Nov-1990  Age: 31 y.o.  Insurance:  Dietitian Info:    Future Appointments   Date Time Provider Department Center   10/04/2021  3:00 PM Odie Sera, MD [facility] [place] Exam   10/12/2021 11:30 AM Hasselwander, Ernest Haber, APRN-NP [id number] [place] Exam   10/12/2021 12:00 PM [facility] None         Diagnosis & Reason for Visit:  BRCA1 and PMS2 genetic mutations. Family history of breast and ovarian cancers.       Physician Info:   ? Referring Physician:  Dr. Alen Blew   ? PCP:  Dr. Bonnielee Haff     Reproductive History    Menarche: Age 65  Number of pregnancies: None   Oral Contraceptive Use:  Yes; Years: Oral birth control pills used for approximately eight to nine years.      Menopause Status Postmenopausal  Age of last period: 40 (surgical)  Gynecologic Surgical History:   11/14/20 hysterectomy and bilateral salpingo oophorectomy by Dr. [name] on 11/14/20.   Hormone Replacement Therapy Use:  Yes Estrogen pills started in 01/2021 and is currently using.    Years: started 01/2021   Last dose: currently using    Allergies reviewed and verified with the patient, and documented in Epic:  Yes     Family History of Cancer:   Iram has a personal history of ovarian cancer that was diagnosed in 07/2020.  She had a hysterectomy and bilateral salpingo oophorectomy in 10/2020 by Dr. [name]. Lun received six cycles of Carbo/Taxol beginning in 08/2020 and ending in 12/2020. Dr. [name] prescribed Olaparib and Cherylin started this on 03/03/21.  Mother was diagnosed with unilateral breast cancer at 31 years of age and with ovarian cancer at 31 years of age. She passed away at 47 years of age.   Father was diagnosed with kidney cancer at 31 years of age.   Paternal grandmother was diagnosed with breast cancer at 31 years of age.     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    Mother Camela states that her mother's genetic test result also was positive for a BRCA1 genetic mutation. She will bring a copy of her Mother's genetic test result with her to the appointment.    Sister, Timmie Calix states that [name]'s genetic test result was negative.      Breast Health History & Imaging:  Date Test Results   None Shenise did not have prior breast imaging or surgeries.       RISK MODELS   Tyrer Cuzick:    10 Year Risk: 11% 10 Year Population Risk: 0.5%    Lifetime Risk: 58.7% Lifetime Population Risk: 13.3%                *Tyrer-Cuzick Score reflects preliminary calculations. The final Tyrer-Cuzick Score will be recorded by your Provider at the consultation.     GAIL Scale:    Peta has BRCA1 and PMS2 genetic mutations so the [facility] was not completed.     Comments:       1.74  meters and 93 kg      Additional Family Information:     Avan has 1 sister at 44 years of age and 1 brother at 49 years of age. She has 2 maternal aunts in their sixties and no maternal uncles. Female has 1 paternal aunt in her early sixties and 1 paternal uncle in his early sixties. Her paternal grandfather passed away during his early sixties and maternal grandparents passed away in their seventies/eighties.

## 2021-08-09 ENCOUNTER — Encounter: Admit: 2021-08-09 | Discharge: 2021-08-09 | Payer: 59

## 2021-08-09 DIAGNOSIS — C569 Malignant neoplasm of unspecified ovary: Secondary | ICD-10-CM

## 2021-08-09 LAB — CBC AND DIFF

## 2021-08-09 LAB — COMPREHENSIVE METABOLIC PANEL

## 2021-08-21 ENCOUNTER — Encounter: Admit: 2021-08-21 | Discharge: 2021-08-21 | Payer: 59

## 2021-09-06 ENCOUNTER — Encounter: Admit: 2021-09-06 | Discharge: 2021-09-06 | Payer: 59

## 2021-09-06 DIAGNOSIS — Z5111 Encounter for antineoplastic chemotherapy: Secondary | ICD-10-CM

## 2021-09-06 DIAGNOSIS — C569 Malignant neoplasm of unspecified ovary: Secondary | ICD-10-CM

## 2021-09-06 NOTE — Telephone Encounter
Received incoming fax from [facility] of labs drawn on 09/05/21. Will have Dr. Martin Majestic and team review. Copy of lab results sent to medical records to be scanned to patient's chart.

## 2021-09-12 ENCOUNTER — Encounter: Admit: 2021-09-12 | Discharge: 2021-09-12 | Payer: 59

## 2021-09-29 ENCOUNTER — Encounter: Admit: 2021-09-29 | Discharge: 2021-09-29 | Payer: 59

## 2021-09-29 MED ORDER — ESTRADIOL 0.5 MG PO TAB
1 mg | ORAL_TABLET | Freq: Every day | ORAL | 1 refills
Start: 2021-09-29 — End: ?

## 2021-10-04 ENCOUNTER — Encounter: Admit: 2021-10-04 | Discharge: 2021-10-04 | Payer: 59

## 2021-10-04 DIAGNOSIS — C569 Malignant neoplasm of unspecified ovary: Secondary | ICD-10-CM

## 2021-10-04 DIAGNOSIS — Z1231 Encounter for screening mammogram for malignant neoplasm of breast: Secondary | ICD-10-CM

## 2021-10-04 DIAGNOSIS — Z9189 Other specified personal risk factors, not elsewhere classified: Secondary | ICD-10-CM

## 2021-10-04 DIAGNOSIS — Z1239 Encounter for other screening for malignant neoplasm of breast: Secondary | ICD-10-CM

## 2021-10-04 DIAGNOSIS — R000 Tachycardia, unspecified: Secondary | ICD-10-CM

## 2021-10-04 DIAGNOSIS — U071 COVID: Secondary | ICD-10-CM

## 2021-10-04 DIAGNOSIS — Z1501 Genetic susceptibility to malignant neoplasm of breast: Secondary | ICD-10-CM

## 2021-10-04 NOTE — Research Notes
------  PROGECT-Trial: Consent to Trial Participation-----    Study Titile: PROspective evaluation of GErmline mutations, Cancer outcome and Tissue biomarkers (P.R.O.G.E.C.T.)  PROspective evaluation of GErmline mutations, Cancer outcome and Tissue  biomarkers (P.R.O.G.E.C.T.): A registry for patients with triple negative breast cancer  and germline mutations  HSC: [id number]  Consent Version Date: Approval Period 11/23/2020 - 11/22/2021  Consent Signed: 10/04/2021    Clinical trial participation and research nature of the trial were discussed with the participant by the investigator during this visit. Participant was alert and oriented during consent discussion. Participant was informed that clinical trial is voluntary and they may withdraw consent at any time for any reason by notifying study team. Study purpose, procedures, tests, samples to be obtained, potential side effects, benefits, foreseeable risks and duration of study were discussed. HIPAA information, compensation and insurance pre-certification were discussed per consent form. An appointment with the financial counselor will be scheduled for the participant where applicable. Alternative courses of treatment were also discussed per consent. Participant verbalized understanding.      Participant was given time to review the consent form and to discuss participation in this study. Questions asked were answered to their satisfaction and participant voiced desire to sign consent today. Participant signed consent without coercion and undue influence. A copy of the signed consent was given to the participant and emailed to [facility] Information Management (HIM) for scanning into the participant's medical record in [employer]. Contact information for the study team was given to the participant. Participant was informed study participation can be terminated due to disease progression, participant withdrawal, unanticipated toxicity, investigator discretion, sponsor or FDA. Participant was informed that new findings involving potential risk will be discussed for continuous consenting to trial.     No research specific procedures took place prior to consenting.    -RSachedina

## 2021-10-04 NOTE — Progress Notes
Breast Cancer Risk Assessment and Prevention Consultation    Date of Service: 10/04/2021    Reason for Visit: Breast Cancer Risk Assessment & Prevention Consultation    Present History:     Kristina Velazquez is a 31 y.o. female here today in consultation for risk assessment and counseling. 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    Kristina Velazquez has a family history significant for breast cancer diagnosed in her mother at age 26 and then ovarian cancer at age 28. Her mother had genetic testing when she was diagnosed with ovarian cancer which showed a BRCA1 mutation.     Kristina Velazquez was diagnosed with ovarian cancer in July 2022 and received chemotherapy followed by hysterectomy and bilateral salpingo-oophorectomy in October 2022 followed by chemotherapy and is now on olaparib since February 2023.  She states she is tolerating this well.    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    She denies any breast concerns or complaints today.  She is on estrogen hormone replacement therapy since January 2023 and denies any vasomotor symptoms.  She does have decreased libido and discomfort with intercourse.         Medical History:   Diagnosis Date   ? COVID 01/29/2020   ? Inappropriate sinus tachycardia    ? Ovarian cancer [facility])        Surgical History:   Procedure Laterality Date   ? HX SHOULDER SURGERY Right 2008   ? HX WISDOM TEETH EXTRACTION  2014   ? placement of port-a-cath with fluoroscopy Left 08/25/2020    Performed by Freund, Alecia Lemming., MD at [facility] OR   ? FLUOROSCOPIC GUIDANCE CENTRAL VENOUS ACCESS DEVICE PLACEMENT/ REPLACEMENT/ REMOVAL Left 08/25/2020    Performed by Freund, Alecia Lemming., MD at [facility] OR   ? ESOPHAGOGASTRODUODENOSCOPY WITH SPECIMEN COLLECTION BY BRUSHING/ WASHING N/A 11/13/2020    Performed by Comer Locket, MD at [facility] ENDO   ? COLONOSCOPY DIAGNOSTIC WITH SPECIMEN COLLECTION BY BRUSHING/ WASHING - FLEXIBLE N/A 11/13/2020    Performed by Comer Locket, MD at [facility] ENDO   ? TOTAL ABDOMINAL HYSTERECTOMY AND BILATERAL SALPINGO-OOPHORECTOMY WITH OMENTECTOMY AND RADICAL RESECTION FOR DEBULKING, APPENDECTOMY, Bilateral 11/14/2020    Performed by Eileen Stanford, MD at [facility] OR   ? Cytoreductive surgery; largest tumor approx. 6 cm Hyperthermic Intraperitoneal chemotherapy  N/A 11/14/2020    Performed by Dia Crawford, MD at [facility] OR   ? HX CHOLECYSTECTOMY         Prevention   Onc Timeline Overview Note   Kristina Velazquez is a 31 y.o. female patient of Dr. 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 Here today after starting Olaparib 300mg  BID on 03/03/21.    She is tolerating her medication well. She was taking Zofran for the first 2 to 3 weeks of treatment to help with her nausea, but it has improved. She no longer needs the Zofran.    Reproductive History    Menarche: Age 59  Number of pregnancies: None   Oral Contraceptive Use:  Yes; Years: Oral birth control pills used for approximately eight to nine years.      Menopause Status Postmenopausal  Age of last period: 31 (surgical)  Gynecologic Surgical History:   11/14/20 hysterectomy and bilateral salpingo oophorectomy by Dr. [name] on 11/14/20.   Hormone Replacement  Therapy Use:  Yes Estrogen pills started in 01/2021 and is currently using.    Years: started 01/2021   Last dose: currently using    Allergies reviewed and verified with the patient, and documented in Epic:  Yes     Family History of Cancer:   Kristina Velazquez has a personal history of ovarian cancer that was diagnosed in 07/2020.  She had a hysterectomy and bilateral salpingo oophorectomy in 10/2020 by Dr. [name]. Kristina Velazquez received six cycles of Carbo/Taxol beginning in 08/2020 and ending in 12/2020. Dr. [name] prescribed Olaparib and Aquanette started this on 03/03/21.  Mother was diagnosed with unilateral breast cancer at 31 years of age and with ovarian cancer at 31 years of age. She passed away at 7 years of age.   Father was diagnosed with kidney cancer at 31 years of age.   Paternal grandmother was diagnosed with breast cancer at 31 years of age.     Previous Genetic Testing: (Self, Family)  Self/Family Exact Results/Specific Mutations/Variances   Self [facility] on 09/26/20 (see report in Media)   BRCA1 pathogenic mutation: [id number]  PMS2 pathogenic mutation: p.R134*  ATM Variant of Unknown Significance: [id number]   MLH1 Variant of Unknown Significance: EX16_3'UTRdup  Janyth denies Ashkenazi Jewish descent.    Mother Kristina Velazquez states that her mother's genetic test result also was positive for a BRCA1 genetic mutation. She will bring a copy of her Mother's genetic test result with her to the appointment.    Sister, Kristina Velazquez states that Kristina Velazquez's genetic test result was negative.      Breast Health History & Imaging:  Date Test Results   None Aleria did not have prior breast imaging or surgeries.       RISK MODELS   Tyrer Cuzick:    10 Year Risk: 11% 10 Year Population Risk: 0.5%    Lifetime Risk: 58.7% Lifetime Population Risk: 13.3%                *Tyrer-Cuzick Score reflects preliminary calculations. The final Tyrer-Cuzick Score will be recorded by your Provider at the consultation.     GAIL Scale:    Syeda has BRCA1 and PMS2 genetic mutations so the [facility] was not completed.       Her hot flashes have improved. She denies any bleeding, abnormal discharge, or changes in bowel habits. She denies any problems with leakage of urine, vaginal dryness, or pain.    This week, she felt more fatigued, which she attributes to fighting off an illness. On Tuesday, 07/03/2021, she struggled to stay awake. On Wednesday, 07/04/2021, she left work early due to feeling unwell. Today, 07/06/2021, she feels great and states she feels great most days.    She has returned to work, which is going well although it is stressful.  She plans to travel at the end of 06/2021. She also has a cruise planned from 11/04/2021 through 11/11/2021.       Ovarian cancer (HCC)   08/08/2020 Imaging    CT scan A/P demonstrated: Ill-defined complex pelvic mass with omental and mesenteric masses with intra-abdominal ascites suggesting a neoplastic process with metastatic disease. No other intraparenchymal metastases    CA125 = 862     08/16/2020 Biopsy    High grade serous carcinoma. ER 85%, PR 10%, positive PAX8, WT-1, p53     09/04/2020 - 01/24/2021 Chemotherapy    Had reaction to both taxol and carbo in C1, plan for inpatient C2  OP GYN CARBOPLATIN (desensitization) +  PACLITAXEL (desensitization) (OVARIAN)  Plan Provider: Eileen Stanford, MD  Treatment goal: Curative   Line of treatment: First Line     10/10/2020 Pertinent Labs    Caris:  Genomic LOH high  BRCA negative  MSI stable  CCNE1 not amplified  Her 2 neg    Pt would be candidate for PARP maintenance       10/25/2020 Imaging    CT CAP: no disease in chest. Decrease in size of the bilateral adnexal masses, peritoneal metastases, and malignant ascites suggesting favorable response to therapy by the patient's metastatic ovarian cancer.      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  Interval hysterectomy, omentectomy and tumor debulking. Minimal residual areas of peritoneal thickening and nodularity, indeterminate and may be postoperative scarring or from minute residual/recurrent tumor. No   ascites.   2. No new abdominopelvic lymphadenopathy.      02/23/2021 -  Chemotherapy    OP GYN OLAPARIB  Plan Provider: Eileen Stanford, MD  Treatment goal: Control  Line of treatment: Maintenance         General Health and Social History  Kristina Velazquez is married. She works as an Psychologist, educational at [employer].  Exercise: walking daily, 30-31min/day  Tobacco History: never smoker  Alcohol Use: none    Review of Systems   Constitutional: Negative for chills, diaphoresis, fatigue, fever and unexpected weight change.   HENT: Negative for mouth sores, nosebleeds and sinus pressure.    Eyes: Negative for pain and visual disturbance.   Respiratory: Negative for cough, shortness of breath and wheezing.    Cardiovascular: Negative for chest pain and palpitations.   Gastrointestinal: Negative for abdominal distention, abdominal pain, constipation, diarrhea, nausea and vomiting.   Genitourinary: Negative for menstrual problem and vaginal bleeding.   Musculoskeletal: Negative for arthralgias, back pain and myalgias.   Skin: Negative for rash and wound.   Neurological: Negative for dizziness, weakness, light-headedness, numbness and headaches.   Hematological: Negative for adenopathy. Does not bruise/bleed easily.   Psychiatric/Behavioral: Negative for sleep disturbance. The patient is not nervous/anxious.            Allergies   Allergen Reactions   ? Carboplatin CHEST TIGHTNESS, FLUSHING (SKIN), HEADACHE, RASH, SHORTNESS OF BREATH, SEE COMMENTS and REDNESS     Did not complete the remainder of the infusion.  Additional reaction 01/24/21 during bag A, rash located neck, chest and back of ears, dyspnea, hypertension with BP 140/94, tachycardia with HR 138 and chest tightness.  Completed the remainder of the infusion.   ? Hydrocodone STOMACH UPSET and VOMITING   ? Paclitaxel CHEST TIGHTNESS, VISION CHANGES and REDNESS   ? Peanut RASH       Objective:         ? acetaminophen (ACETAMINOPHEN EXTRA STRENGTH) 500 mg tablet Take one tablet to two tablets by mouth every 6 hours as needed for Pain. Max of 4,000 mg of acetaminophen in 24 hours.   ?  ALPRAZolam (XANAX) 1 mg tablet Take one tablet by mouth daily as needed for Anxiety.   ? amphetamine-dextroamphetamine XR (ADDERALL XR) 30 mg capsule Take one capsule by mouth twice daily.   ? DIPH/LIDO/ANTACID 1:1:1 ORAL SUSPENSION (COMPOUND) Swish and Spit 5 mL by mouth as directed every 4 hours as needed.   ? diphenhydrAMINE hcl (BENADRYL ALLERGY) 25 mg tablet Take one tablet to two tablets by mouth at bedtime as needed.   ? docusate (COLACE) 100 mg capsule Take one capsule by mouth as Needed for Constipation. Indications: constipation   ? estradioL (ESTRACE) 0.5 mg tablet Take two tablets by mouth daily.   ? gabapentin (NEURONTIN) 100 mg capsule Take one capsule by mouth three times daily as needed. Indications: neuropathic pain   ? ibuprofen (MOTRIN) 800 mg tablet Take one tablet by mouth every 8 hours as needed. Take with food.   ? lidocaine/prilocaine (EMLA) 2.5/2.5 % topical cream Apply  topically to affected area as Needed. Apply 30-60 minutes prior to port access.   ? melatonin 5 mg chew Chew 10 mg by mouth nightly as needed (sleep).   ? MOUNJARO 2.5 mg/0.5 mL injector PEN Inject  under the skin every 7 days.   ? MULTIVITAMIN PO Take 1 tablet by mouth daily.   ? olaparib (LYNPARZA) 150 mg tablet Take two tablets by mouth twice daily.   ? ondansetron HCL (ZOFRAN) 8 mg tablet Take 1 tablet by mouth twice a day 30 minutes prior to each Olaparib dose.  Indications: prevent nausea and vomiting from cancer chemotherapy   ? pantoprazole DR (PROTONIX) 40 mg tablet Take one tablet by mouth at bedtime daily.   ? polyethylene glycol 3350 (MIRALAX) 17 gram/dose powder Take seventeen g by mouth daily. (Patient taking differently: Take seventeen g by mouth daily as needed for Constipation.)   ? prochlorperazine maleate (COMPAZINE) 10 mg tablet Take one tablet by mouth every 6 hours as needed for Nausea or Vomiting.   ? propranoloL (INDERAL) 10 mg tablet Take one tablet by mouth every 6 hours as needed. 1 every 6 hours as needed for tremor   ? traMADoL (ULTRAM) 50 mg tablet Take 50 mg by mouth every 8 hours as needed for Pain.     Vitals:    10/04/21 1451   BP: 109/71   BP Source: Arm, Left Upper   Pulse: 106   Temp: 36.1 ?C (97 ?F)   Resp: 16   SpO2: 98%   TempSrc: Temporal   PainSc: Zero   Weight: 90.7 kg (200 lb)   Height: 171 cm (5' 7.32)     Body mass index is 31.02 kg/m?Marland Kitchen       Pain Addressed:  N/A    [place] Cooperative Oncology Group performance status is 0, Fully active, able to carry on all pre-disease performance without restriction.Marland Kitchen     Physical Exam  Constitutional:       Appearance: She is well-developed.   HENT:      Head: Normocephalic and atraumatic.   Eyes:      General: No scleral icterus.     Conjunctiva/sclera: Conjunctivae normal.   Cardiovascular:      Rate and Rhythm: Normal rate.   Pulmonary:      Effort: Pulmonary effort is normal. No respiratory distress.   Chest:   Breasts:     Right: No inverted nipple, mass, nipple discharge, skin change or tenderness.      Left: No inverted nipple, mass, nipple discharge, skin change or  tenderness.       Abdominal:      General: There is no distension.      Palpations: Abdomen is soft.      Tenderness: There is no abdominal tenderness.   Musculoskeletal:         General: No tenderness.      Cervical back: Normal range of motion and neck supple.   Lymphadenopathy:      Upper Body:      Right upper body: No supraclavicular or axillary adenopathy.      Left upper body: No supraclavicular or axillary adenopathy.   Skin:     General: Skin is warm and dry.      Findings: No rash.   Neurological:      Mental Status: She is alert and oriented to person, place, and time.   Psychiatric:         Behavior: Behavior normal.         Thought Content: Thought content normal.            Mammographic Breast Density: N/A      Patient Evaluated for a Clinical Trial: Patient not eligible for a treatment trial (including not needing treatment).      Assessment and Plan:  Kem Parcher is a 31 y.o. woman who presents to the [facility] Breast [facility] for Breast Cancer Risk Assessment and Prevention Consultation.       Risk Assessment: I have reviewed Kristina Velazquez's genetic testing results. Kristina Velazquez completed genetic testing with the [facility] BRCA Next and expanded cancer panel on 09/26/20. Kristina Velazquez is noted to have a BRCA1 gene mutation. Her lifetime risk of breast cancer is estimated as high as 87%, compared to the general population risk of 12%.     Screening: Due to the increased risk of breast cancer associated with this gene mutation, modifications to standard population screening are recommended based on [employer] (NCCN) guidelines.  Twice yearly clinical breast exam is suggested. In addition to annual mammography with tomosynthesis, breast MRI on an annual basis is recommended. Discussed the risks and benefits of breast MRI including increased sensitivity but also increase risk for finding benign disease, increased potential for biopsy up to and financial cost. Counseled on self-breast awareness.     While there may be an increased risk for pancreatic cancer, pancreatic cancer screening in the setting of a High Risk GI Clinic is recommended to BRCA1 mutation carriers with a first or second degree relative who has been diagnosed with pancreatic cancer.     Risk Reduction:  We discussed breast cancer risk reducing strategies including lifestyle modifications, chemoprevention and risk reducing mastectomy.     Lifestyle Modifications for Breast Cancer Risk Reduction:  Current recommendations by the [employer] Research [employer]) and the American Cancer Society (ACS) encourage the following recommendations for a healthy lifestyle:   - Maintain a healthy weight including an increase in fruits and vegetables, and limiting high fat and high sugar foods. - Exercise at least 225 minutes per week of moderate intensity or 150 minutes of high intensity cardiovascular exercise per week and include strength training 2-3 times per week.    - Try and eat whole foods and limit vitamins and supplements and discuss them with your physician.       We discussed the role of chemoprevention, however these medications are not well studied in unaffected BRCA1 and BRCA2 mutation carriers. We discussed that BRCA1 carriers have a higher risk of hormone negative  breast cancer and our traditional chemoprevention agents are anti-hormone pills and may not be as effective in reducing hormone negative breast cancer. We do not know the effect of PARPi on breast cancer risk in BRCA1 carriers although this is being explored.     We reviewed the option of risk reducing mastectomy in BRCA mutation carriers which can decrease the risk of breast cancer by over 95%. She is interested in this option in the future. We discussed that typically patients are > 1 year out from completion of their surgery for ovarian cancer when they are considered for risk reducing surgery. With the adjuvant PARPi this would typically need to be held for surgery or wait until therapy is completed due to concern for wound healing with surgery.      It was a pleasure providing this risk assessment to Kristina Velazquez and we recommend she follow up in 6 months for clinical breast exam. Will try to coordinate visits when she sees her gynecologic oncology team. Will plan mammogram for Monday when she returns to see Dr. [name]. Will schedule MRI to coordinate with her next 3 month follow up with Dr. [name].         Joni Reining, MD  Breast Medical Oncology  Associate Professor of Internal Medicine, Division of Medical Oncology  [facility]

## 2021-10-05 ENCOUNTER — Encounter: Admit: 2021-10-05 | Discharge: 2021-10-05 | Payer: 59

## 2021-10-05 DIAGNOSIS — C569 Malignant neoplasm of unspecified ovary: Secondary | ICD-10-CM

## 2021-10-08 ENCOUNTER — Encounter: Admit: 2021-10-08 | Discharge: 2021-10-08 | Payer: 59

## 2021-10-08 DIAGNOSIS — Z9189 Other specified personal risk factors, not elsewhere classified: Secondary | ICD-10-CM

## 2021-10-08 DIAGNOSIS — C569 Malignant neoplasm of unspecified ovary: Secondary | ICD-10-CM

## 2021-10-08 DIAGNOSIS — U071 COVID: Secondary | ICD-10-CM

## 2021-10-08 DIAGNOSIS — R000 Tachycardia, unspecified: Secondary | ICD-10-CM

## 2021-10-08 DIAGNOSIS — Z5111 Encounter for antineoplastic chemotherapy: Secondary | ICD-10-CM

## 2021-10-08 DIAGNOSIS — Z1231 Encounter for screening mammogram for malignant neoplasm of breast: Secondary | ICD-10-CM

## 2021-10-08 DIAGNOSIS — Z1501 Genetic susceptibility to malignant neoplasm of breast: Secondary | ICD-10-CM

## 2021-10-08 DIAGNOSIS — Z1239 Encounter for other screening for malignant neoplasm of breast: Secondary | ICD-10-CM

## 2021-10-08 LAB — CBC AND DIFF
ABSOLUTE BASO COUNT: 0.1 K/UL (ref 0–0.20)
ABSOLUTE EOS COUNT: 0 K/UL (ref 0–0.45)
ABSOLUTE LYMPH COUNT: 1.5 K/UL (ref 1.0–4.8)
ABSOLUTE MONO COUNT: 0.6 K/UL (ref 0–0.80)
ABSOLUTE NEUTROPHIL: 10 K/UL — ABNORMAL HIGH (ref 1.8–7.0)
BASOPHILS %: 0 % (ref >60)
EOSINOPHILS %: 0 % (ref 0–5)
HEMATOCRIT: 41 % (ref 36–45)
HEMOGLOBIN: 14 g/dL (ref 12.0–15.0)
LYMPHOCYTES %: 12 % — ABNORMAL LOW (ref 24–44)
MCH: 33 pg (ref 26–34)
MCHC: 34 g/dL (ref 32.0–36.0)
MCV: 99 FL (ref 80–100)
MONOCYTES %: 5 % (ref 4–12)
MPV: 7 FL (ref 7–11)
NEUTROPHILS %: 83 % — ABNORMAL HIGH (ref 41–77)
PLATELET COUNT: 307 K/UL (ref 150–400)
RBC COUNT: 4.1 M/UL (ref 4.0–5.0)
RDW: 15 % — ABNORMAL HIGH (ref 11–15)
WBC COUNT: 12 K/UL — ABNORMAL HIGH (ref 4.5–11.0)

## 2021-10-08 LAB — CA125: CA-125: 8 U/mL (ref <35)

## 2021-10-08 LAB — COMPREHENSIVE METABOLIC PANEL: SODIUM: 138 MMOL/L (ref 137–147)

## 2021-11-15 ENCOUNTER — Encounter: Admit: 2021-11-15 | Discharge: 2021-11-15 | Payer: 59

## 2021-11-15 NOTE — Telephone Encounter
Received incoming fax from [facility] of labs drawn on 11/14/21. Will have Dr. Martin Majestic and team review. Copy of results sent to medical records to be scanned to patient's chart.

## 2021-11-20 ENCOUNTER — Encounter: Admit: 2021-11-20 | Discharge: 2021-11-20 | Payer: 59

## 2021-11-27 ENCOUNTER — Encounter: Admit: 2021-11-27 | Discharge: 2021-11-27 | Payer: 59

## 2021-11-27 DIAGNOSIS — C569 Malignant neoplasm of unspecified ovary: Secondary | ICD-10-CM

## 2021-12-04 ENCOUNTER — Encounter: Admit: 2021-12-04 | Discharge: 2021-12-04 | Payer: 59

## 2021-12-04 DIAGNOSIS — C569 Malignant neoplasm of unspecified ovary: Secondary | ICD-10-CM

## 2021-12-04 MED ORDER — ONDANSETRON HCL 8 MG PO TAB
ORAL_TABLET | ORAL | 3 refills | 8.00000 days | Status: AC
Start: 2021-12-04 — End: ?

## 2021-12-04 MED ORDER — ONDANSETRON HCL 8 MG PO TAB
ORAL_TABLET | 3 refills | Status: CN
Start: 2021-12-04 — End: ?

## 2021-12-04 MED ORDER — OLAPARIB 150 MG PO TAB
300 mg | ORAL_TABLET | Freq: Two times a day (BID) | ORAL | 3 refills | Status: AC
Start: 2021-12-04 — End: ?
  Filled 2021-12-13: qty 60, 15d supply, fill #1

## 2021-12-04 MED ORDER — OLAPARIB 150 MG PO TAB
300 mg | ORAL_TABLET | Freq: Two times a day (BID) | ORAL | 3 refills | Status: CN
Start: 2021-12-04 — End: ?

## 2021-12-06 ENCOUNTER — Encounter: Admit: 2021-12-06 | Discharge: 2021-12-06 | Payer: 59

## 2021-12-06 MED ORDER — ESTRADIOL 0.025 MG/24 HR TD PTSW
1 | MEDICATED_PATCH | TRANSDERMAL | 11 refills | 30.00000 days | Status: AC
Start: 2021-12-06 — End: ?

## 2021-12-11 ENCOUNTER — Encounter: Admit: 2021-12-11 | Discharge: 2021-12-11 | Payer: 59

## 2021-12-11 DIAGNOSIS — C569 Malignant neoplasm of unspecified ovary: Secondary | ICD-10-CM

## 2021-12-11 MED ORDER — ONDANSETRON HCL 8 MG PO TAB
ORAL_TABLET | ORAL | 3 refills | 8.00000 days | Status: AC
Start: 2021-12-11 — End: ?
  Filled 2021-12-12: 23d supply

## 2021-12-11 MED ORDER — OLAPARIB 150 MG PO TAB
300 mg | ORAL_TABLET | Freq: Two times a day (BID) | ORAL | 3 refills | Status: CN
Start: 2021-12-11 — End: ?

## 2021-12-11 NOTE — Progress Notes
Pharmacy Benefits Investigation    Medication name: LYNPARZA 150 MG PO TAB    The insurance requires a prior authorization for the medication. The prior authorization was submitted via CoverMyMeds. Will follow up in 2 business days.    Magdalen Spatz  Specialty Pharmacy Patient Advocate

## 2021-12-12 ENCOUNTER — Encounter: Admit: 2021-12-12 | Discharge: 2021-12-12 | Payer: 59

## 2021-12-12 NOTE — Progress Notes
Pharmacy Benefits Investigation    Medication name: LYNPARZA 150 MG PO TAB    The prior authorization was approved for [name] from 12/11/2021 through 12/12/2022. The prior authorization number is [id number].    The out of pocket cost is $0.    Contacted [name] to discuss the approval and copay. Left voicemail asking patient to return call to the specialty pharmacy billing team at [phone]. Will follow up in 2 business days if patient has not returned call.    Magdalen Spatz  Specialty Pharmacy Patient Advocate

## 2021-12-13 ENCOUNTER — Encounter: Admit: 2021-12-13 | Discharge: 2021-12-13 | Payer: 59

## 2021-12-14 ENCOUNTER — Encounter: Admit: 2021-12-14 | Discharge: 2021-12-14 | Payer: 59

## 2021-12-14 NOTE — Telephone Encounter
Notified [facility] specialty pharmacy that patient will not be filling her Lynparza through that pharmacy but will be filling trough [facility] so they can cancel the refill request.

## 2021-12-18 ENCOUNTER — Encounter: Admit: 2021-12-18 | Discharge: 2021-12-18 | Payer: 59

## 2021-12-19 ENCOUNTER — Encounter: Admit: 2021-12-19 | Discharge: 2021-12-19 | Payer: 59

## 2021-12-19 DIAGNOSIS — C569 Malignant neoplasm of unspecified ovary: Secondary | ICD-10-CM

## 2021-12-26 ENCOUNTER — Encounter: Admit: 2021-12-26 | Discharge: 2021-12-26 | Payer: 59

## 2021-12-27 MED FILL — OLAPARIB 150 MG PO TAB: 150 mg | ORAL | 15 days supply | Qty: 60 | Fill #2 | Status: AC

## 2022-01-01 ENCOUNTER — Encounter: Admit: 2022-01-01 | Discharge: 2022-01-01 | Payer: 59

## 2022-01-03 ENCOUNTER — Encounter: Admit: 2022-01-03 | Discharge: 2022-01-03 | Payer: 59

## 2022-01-07 ENCOUNTER — Encounter: Admit: 2022-01-07 | Discharge: 2022-01-07 | Payer: 59

## 2022-01-07 MED FILL — OLAPARIB 150 MG PO TAB: 150 mg | ORAL | 15 days supply | Qty: 60 | Fill #3 | Status: AC

## 2022-01-09 ENCOUNTER — Encounter: Admit: 2022-01-09 | Discharge: 2022-01-09 | Payer: BC Managed Care – PPO

## 2022-01-11 ENCOUNTER — Encounter: Admit: 2022-01-11 | Discharge: 2022-01-11 | Payer: BC Managed Care – PPO

## 2022-01-11 ENCOUNTER — Ambulatory Visit: Admit: 2022-01-11 | Discharge: 2022-01-11 | Payer: BC Managed Care – PPO

## 2022-01-11 DIAGNOSIS — I4711 Inappropriate sinus tachycardia: Secondary | ICD-10-CM

## 2022-01-11 DIAGNOSIS — C569 Malignant neoplasm of unspecified ovary: Secondary | ICD-10-CM

## 2022-01-11 DIAGNOSIS — Z1239 Encounter for other screening for malignant neoplasm of breast: Secondary | ICD-10-CM

## 2022-01-11 DIAGNOSIS — U071 COVID: Secondary | ICD-10-CM

## 2022-01-11 DIAGNOSIS — Z1501 Genetic susceptibility to malignant neoplasm of breast: Secondary | ICD-10-CM

## 2022-01-11 DIAGNOSIS — Z9189 Other specified personal risk factors, not elsewhere classified: Secondary | ICD-10-CM

## 2022-01-11 LAB — COMPREHENSIVE METABOLIC PANEL
CHLORIDE: 103 MMOL/L (ref 98–110)
POTASSIUM: 3.4 MMOL/L — ABNORMAL LOW (ref 3.5–5.1)
SODIUM: 139 MMOL/L (ref 137–147)
TOTAL BILIRUBIN: 0.8 mg/dL — ABNORMAL LOW (ref 0.3–1.2)

## 2022-01-11 LAB — CBC AND DIFF
ABSOLUTE LYMPH COUNT: 1.3 K/UL (ref 1.0–4.8)
HEMOGLOBIN: 14 g/dL (ref 12.0–15.0)
RBC COUNT: 4.4 M/UL (ref 4.0–5.0)

## 2022-01-11 LAB — CA125: CA-125: 10 U/mL (ref <35)

## 2022-01-11 MED ORDER — SODIUM CHLORIDE 0.9 % IJ SOLN
50 mL | Freq: Once | INTRAVENOUS | 0 refills | Status: CP
Start: 2022-01-11 — End: ?
  Administered 2022-01-11: 15:00:00 50 mL via INTRAVENOUS

## 2022-01-11 MED ORDER — GADOBENATE DIMEGLUMINE 529 MG/ML (0.1MMOL/0.2ML) IV SOLN
18 mL | Freq: Once | INTRAVENOUS | 0 refills | Status: CP
Start: 2022-01-11 — End: ?
  Administered 2022-01-11: 15:00:00 18 mL via INTRAVENOUS

## 2022-01-13 ENCOUNTER — Encounter: Admit: 2022-01-13 | Discharge: 2022-01-13 | Payer: BC Managed Care – PPO

## 2022-01-14 ENCOUNTER — Encounter: Admit: 2022-01-14 | Discharge: 2022-01-14 | Payer: BC Managed Care – PPO

## 2022-01-15 ENCOUNTER — Encounter: Admit: 2022-01-15 | Discharge: 2022-01-15 | Payer: BC Managed Care – PPO

## 2022-01-16 ENCOUNTER — Encounter: Admit: 2022-01-16 | Discharge: 2022-01-16 | Payer: BC Managed Care – PPO

## 2022-01-16 NOTE — Progress Notes
Contacted Kristina Velazquez to refill their medication(s) LYNPARZA 150 MG PO TAB.    We were unable to reach the patient after three attempts. At this time, we will no longer attempt to contact the patient for the refill. The patient may be re-enrolled in the pharmacy refill management program at any time by contacting the pharmacy or refilling their medication. Ambulatory pharmacist notified.    [place]  [phone]

## 2022-01-17 ENCOUNTER — Encounter: Admit: 2022-01-17 | Discharge: 2022-01-17 | Payer: BC Managed Care – PPO

## 2022-01-17 DIAGNOSIS — C569 Malignant neoplasm of unspecified ovary: Secondary | ICD-10-CM

## 2022-01-17 NOTE — Telephone Encounter
This RN faxed electronically signed copies of CT CAP as well as nursing order for port access/flush on day of CT to [facility] per patient's request. Fax confirmation received.

## 2022-02-04 ENCOUNTER — Encounter: Admit: 2022-02-04 | Discharge: 2022-02-04 | Payer: BC Managed Care – PPO

## 2022-02-04 DIAGNOSIS — C569 Malignant neoplasm of unspecified ovary: Secondary | ICD-10-CM

## 2022-02-04 DIAGNOSIS — Z79890 Hormone replacement therapy (postmenopausal): Secondary | ICD-10-CM

## 2022-02-04 DIAGNOSIS — R232 Flushing: Secondary | ICD-10-CM

## 2022-02-04 DIAGNOSIS — E8940 Asymptomatic postprocedural ovarian failure: Secondary | ICD-10-CM

## 2022-02-04 MED ORDER — ESTRADIOL 0.0375 MG/24 HR TD PTSW
1 | MEDICATED_PATCH | TRANSDERMAL | 3 refills | 30.00000 days | Status: AC
Start: 2022-02-04 — End: ?

## 2022-02-14 ENCOUNTER — Encounter: Admit: 2022-02-14 | Discharge: 2022-02-14 | Payer: BC Managed Care – PPO

## 2022-02-14 MED FILL — OLAPARIB 150 MG PO TAB: 150 mg | ORAL | 15 days supply | Qty: 60 | Fill #4 | Status: AC

## 2022-02-19 ENCOUNTER — Encounter: Admit: 2022-02-19 | Discharge: 2022-02-19 | Payer: BC Managed Care – PPO

## 2022-02-22 ENCOUNTER — Encounter: Admit: 2022-02-22 | Discharge: 2022-02-22 | Payer: BC Managed Care – PPO

## 2022-02-25 ENCOUNTER — Encounter: Admit: 2022-02-25 | Discharge: 2022-02-25 | Payer: BC Managed Care – PPO

## 2022-02-27 ENCOUNTER — Encounter: Admit: 2022-02-27 | Discharge: 2022-02-27 | Payer: BC Managed Care – PPO

## 2022-02-27 NOTE — Progress Notes
Contacted Kristina Velazquez to refill their medication(s) LYNPARZA 150 MG PO TAB.    We were unable to reach the patient after three attempts. At this time, we will no longer attempt to contact the patient for the refill. The patient may be re-enrolled in the pharmacy refill management program at any time by contacting the pharmacy or refilling their medication. Ambulatory pharmacist notified.    [name], [facility]  Outpatient Retail Pharmacy  [phone]

## 2022-02-28 ENCOUNTER — Encounter: Admit: 2022-02-28 | Discharge: 2022-02-28 | Payer: BC Managed Care – PPO

## 2022-02-28 DIAGNOSIS — C569 Malignant neoplasm of unspecified ovary: Secondary | ICD-10-CM

## 2022-02-28 NOTE — Telephone Encounter
New orders for standing monthly labs faxed to [facility] by this RN. Fax confirmation received.

## 2022-03-07 ENCOUNTER — Encounter: Admit: 2022-03-07 | Discharge: 2022-03-07 | Payer: BC Managed Care – PPO

## 2022-03-07 MED FILL — OLAPARIB 150 MG PO TAB: 150 mg | ORAL | 15 days supply | Qty: 60 | Fill #5 | Status: AC

## 2022-03-11 ENCOUNTER — Encounter: Admit: 2022-03-11 | Discharge: 2022-03-11 | Payer: BC Managed Care – PPO

## 2022-03-11 NOTE — Progress Notes
Contacted Kristina Velazquez to refill their medication(s) LYNPARZA 150 MG PO TAB.    On MyChart medication refill questionnaire patient reported missed doses: Missed 4 doses    The refill was processed. Ambulatory pharmacist notified.    [place]  [phone]

## 2022-03-29 ENCOUNTER — Encounter: Admit: 2022-03-29 | Discharge: 2022-03-29 | Payer: BC Managed Care – PPO

## 2022-03-30 ENCOUNTER — Encounter: Admit: 2022-03-30 | Discharge: 2022-03-30 | Payer: BC Managed Care – PPO

## 2022-03-30 MED FILL — OLAPARIB 150 MG PO TAB: 150 mg | ORAL | 15 days supply | Qty: 60 | Fill #6 | Status: AC

## 2022-04-03 ENCOUNTER — Encounter: Admit: 2022-04-03 | Discharge: 2022-04-03 | Payer: BC Managed Care – PPO

## 2022-04-04 NOTE — Progress Notes
Breast Cancer Prevention Clinic    Date of Service: 04/05/2022    Reason for Visit: Breast Cancer Prevention Clinic Follow-up    Present History:     Kristina Velazquez is a 32 y.o. female here today for follow-up. 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    Kristina Velazquez has a family history significant for breast cancer diagnosed in her mother at age 68 and then ovarian cancer at age 66. Her mother had genetic testing when she was diagnosed with ovarian cancer which showed a BRCA1 mutation.     Kristina Velazquez was diagnosed with ovarian cancer in July 2022 and received chemotherapy followed by hysterectomy and bilateral salpingo-oophorectomy in October 2022 followed by chemotherapy and is now on olaparib since February 2023.  She states she is tolerating this well.    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    She denies any breast concerns or complaints today.  She is on estrogen hormone replacement therapy since January 2023.       Medical History:   Diagnosis Date    COVID 01/29/2020    Inappropriate sinus tachycardia     Ovarian cancer [facility])        Surgical History:   Procedure Laterality Date    HX SHOULDER SURGERY Right 2008    HX WISDOM TEETH EXTRACTION  2014    placement of port-a-cath with fluoroscopy Left 08/25/2020    Performed by Freund, Alecia Lemming., MD at [facility] OR    FLUOROSCOPIC GUIDANCE CENTRAL VENOUS ACCESS DEVICE PLACEMENT/ REPLACEMENT/ REMOVAL Left 08/25/2020    Performed by Freund, Alecia Lemming., MD at [facility] OR    ESOPHAGOGASTRODUODENOSCOPY WITH SPECIMEN COLLECTION BY BRUSHING/ WASHING N/A 11/13/2020    Performed by Comer Locket, MD at [facility] ENDO    COLONOSCOPY DIAGNOSTIC WITH SPECIMEN COLLECTION BY BRUSHING/ WASHING - FLEXIBLE N/A 11/13/2020    Performed by Comer Locket, MD at [facility] ENDO    TOTAL ABDOMINAL HYSTERECTOMY AND BILATERAL SALPINGO-OOPHORECTOMY WITH OMENTECTOMY AND RADICAL RESECTION FOR DEBULKING, APPENDECTOMY, Bilateral 11/14/2020    Performed by Eileen Stanford, MD at [facility] OR    Cytoreductive surgery; largest tumor approx. 6 cm Hyperthermic Intraperitoneal chemotherapy  N/A 11/14/2020    Performed by Dia Crawford, MD at [facility] OR    CHEMO CART      HX APPENDECTOMY      HX CHOLECYSTECTOMY      HX HYSTERECTOMY      OVARY SURGERY      TUNNELED VENOUS PORT PLACEMENT         Prevention   Onc Timeline Overview Note   Kristina Velazquez is a 32 y.o. female patient of Dr. 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 Here today after starting Olaparib 300mg  BID on 03/03/21.    She is doing well overall. She denies any bleeding, abnormal discharge, vaginal dryness, or pain.    Since she was last seen she has been having abdominal symptoms; however, this has improved.    Her last CAT scan was in 01/2020.    She has been experiencing an upper respiratory infection due to the weather. She was started on medication for this. She complains of feeling fatigue.     Tyannah Takacs reports that she recently went on a cruise.      Reproductive History    Menarche:  Age 28  Number of pregnancies: None   Oral Contraceptive Use:  Yes; Years: Oral birth control pills used for approximately eight to nine years.      Menopause Status Postmenopausal  Age of last period: 55 (surgical)  Gynecologic Surgical History:   11/14/20 hysterectomy and bilateral salpingo oophorectomy by Dr. [name] on 11/14/20.   Hormone Replacement Therapy Use:  Yes Estrogen pills started in 01/2021 and is currently using.    Years: started 01/2021   Last dose: currently using      Family History of Cancer:   Kristina Velazquez has a personal history of ovarian cancer that was diagnosed in 07/2020.  She had a hysterectomy and bilateral salpingo oophorectomy in 10/2020 by Dr. [name]. Kristina Velazquez received six cycles of Carbo/Taxol beginning in 08/2020 and ending in 12/2020. Dr. [name] prescribed Olaparib and Dessire started this on 03/03/21.  Mother was diagnosed with unilateral breast cancer at 32 years of age and with ovarian cancer at 32 years of age. She passed away at 67 years of age.   Father was diagnosed with kidney cancer at 32 years of age.   Paternal grandmother was diagnosed with breast cancer at 32 years of age.     Previous Genetic Testing: (Self, Family)  Self/Family Exact Results/Specific Mutations/Variances   Self [facility] on 09/26/20 (see report in Media)   BRCA1 pathogenic mutation: [id number]  PMS2 pathogenic mutation: p.R134*  ATM Variant of Unknown Significance: [id number].[id number]   MLH1 Variant of Unknown Significance: EX16_3'UTRdup  [place] denies Ashkenazi Jewish descent.    Mother Kristina Velazquez states that her mother's genetic test result also was positive for a BRCA1 genetic mutation. She will bring a copy of her Mother's genetic test result with her to the appointment.    Sister, Kristina Velazquez states that Kristina Velazquez's genetic test result was negative.      Breast Health History & Imaging:  Date Test Results   None L[place]elle did not have prior breast imaging or surgeries.       RISK MODELS   Tyrer Cuzick:    10 Year Risk: 11% 10 Year Population Risk: 0.5%    Lifetime Risk: 58.7% Lifetime Population Risk: 13.3%                *Tyrer-Cuzick Score reflects preliminary calculations. The final Tyrer-Cuzick Score will be recorded by your Provider at the consultation.     GAIL Scale:    Kristina Velazquez has BRCA1 and PMS2 genetic mutations so the [facility] was not completed.          Ovarian cancer (HCC)   08/08/2020 Imaging    CT scan A/P demonstrated: Ill-defined complex pelvic mass with omental and mesenteric masses with intra-abdominal ascites suggesting a neoplastic process with metastatic disease. No other intraparenchymal metastases    CA125 = 862     08/16/2020 Biopsy    High grade serous carcinoma. ER 85%, PR 10%, positive PAX8, WT-1, p53     09/04/2020 - 01/24/2021 Chemotherapy    Had reaction to both taxol and carbo in C1, plan for inpatient C2  OP GYN CARBOPLATIN (desensitization) + PACLITAXEL (desensitization) (OVARIAN)  Plan Provider: Eileen Stanford, MD  Treatment goal: Curative   Line of treatment: First Line     10/10/2020 Pertinent Labs    Caris:  Genomic LOH high  BRCA negative  MSI stable  CCNE1 not amplified  Her 2 neg    Pt would be candidate for PARP maintenance       10/25/2020 Imaging  CT CAP: no disease in chest. Decrease in size of the bilateral adnexal masses, peritoneal metastases, and malignant ascites suggesting favorable response to therapy by the patient's metastatic ovarian cancer.      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  Interval hysterectomy, omentectomy and tumor debulking. Minimal residual areas of peritoneal thickening and nodularity, indeterminate and may be postoperative scarring or from minute residual/recurrent tumor. No   ascites.   2. No new abdominopelvic lymphadenopathy.      02/23/2021 -  Chemotherapy    OP GYN OLAPARIB  Plan Provider: Eileen Stanford, MD  Treatment goal: Control  Line of treatment: Maintenance         General Health and Social History  Melissasue is married. She works as an Psychologist, educational at [employer].  Exercise: walking daily, 30-4min/day  Tobacco History: never smoker  Alcohol Use: 1-2 drinks/month  GI health: colonoscopy 2022 - negative    Review of Systems   Constitutional:  Negative for chills, diaphoresis, fatigue, fever and unexpected weight change.   HENT:  Negative for mouth sores, nosebleeds and sinus pressure.    Eyes:  Negative for pain and visual disturbance.   Respiratory:  Negative for cough, shortness of breath and wheezing.    Cardiovascular:  Negative for chest pain and palpitations.   Gastrointestinal:  Negative for abdominal distention, abdominal pain, constipation, diarrhea, nausea and vomiting.   Genitourinary:  Negative for menstrual problem and vaginal bleeding.   Musculoskeletal:  Negative for arthralgias, back pain and myalgias.   Skin:  Negative for rash and wound.   Neurological:  Negative for dizziness, weakness, light-headedness, numbness and headaches.   Hematological:  Negative for adenopathy. Does not bruise/bleed easily.   Psychiatric/Behavioral:  Negative for sleep disturbance. The patient is not nervous/anxious.            Allergies   Allergen Reactions    Carboplatin CHEST TIGHTNESS, FLUSHING (SKIN), HEADACHE, RASH, SHORTNESS OF BREATH, SEE COMMENTS and REDNESS     Did not complete the remainder of the infusion.  Additional reaction 01/24/21 during bag A, rash located neck, chest and back of ears, dyspnea, hypertension with BP 140/94, tachycardia with HR 138 and chest tightness.  Completed the remainder of the infusion.    Hydrocodone STOMACH UPSET and VOMITING    Paclitaxel CHEST TIGHTNESS, VISION CHANGES and REDNESS    Peanut RASH       Objective:          amphetamine-dextroamphetamine XR (ADDERALL XR) 30 mg capsule Take one capsule by mouth twice daily.    diphenhydrAMINE hcl (BENADRYL ALLERGY) 25 mg tablet Take one tablet to two tablets by mouth at bedtime as needed.    estradioL (VIVELLE-DOT) 0.0375 mg/24 hr patch Apply one patch to top of skin as directed twice weekly.    lidocaine/prilocaine (EMLA) 2.5/2.5 % topical cream Apply  topically to affected area as Needed. Apply 30-60 minutes prior to port access.    olaparib (  LYNPARZA) 150 mg tablet Take two tablets by mouth twice daily.    ondansetron HCL (ZOFRAN) 8 mg tablet Take 1 tablet by mouth twice a day 30 minutes prior to each Olaparib dose.  Indications: prevent nausea and vomiting from cancer chemotherapy     Vitals:    04/05/22 1125   BP: 117/76   BP Source: Arm, Left Upper   Pulse: 85   Temp: 36.2 ?C (97.2 ?F)   Resp: 16   SpO2: 99%   TempSrc: Temporal   PainSc: Zero   Weight: 103.6 kg (228 lb 6.4 oz)  Comment: with shoes   Height: 170.8 cm (5' 7.25)       Body mass index is 35.51 kg/m?Marland Kitchen       Pain Addressed:  N/A    [place] Cooperative Oncology Group performance status is 0, Fully active, able to carry on all pre-disease performance without restriction.Marland Kitchen     Physical Exam  Constitutional:       Appearance: She is well-developed.   HENT:      Head: Normocephalic and atraumatic.   Eyes:      General: No scleral icterus.     Conjunctiva/sclera: Conjunctivae normal.   Cardiovascular:      Rate and Rhythm: Normal rate.   Pulmonary:      Effort: Pulmonary effort is normal. No respiratory distress.   Chest:   Breasts:     Right: No inverted nipple, mass, nipple discharge, skin change or tenderness.      Left: No inverted nipple, mass, nipple discharge, skin change or tenderness.       Abdominal:      General: There is no distension.      Palpations: Abdomen is soft.      Tenderness: There is no abdominal tenderness.   Musculoskeletal:         General: No tenderness.      Cervical back: Normal range of motion and neck supple.   Lymphadenopathy:      Upper Body:      Right upper body: No supraclavicular or axillary adenopathy.      Left upper body: No supraclavicular or axillary adenopathy.   Skin:     General: Skin is warm and dry.      Findings: No rash.   Neurological:      Mental Status: She is alert and oriented to person, place, and time.   Psychiatric:         Behavior: Behavior normal.         Thought Content: Thought content normal.            Mammographic Breast Density: N/A      Patient Evaluated for a Clinical Trial: Patient not eligible for a treatment trial (including not needing treatment).      Assessment and Plan:  Alese Oats is a 32 y.o. woman who presents to the High [facility] Breast Clinic for follow-up. She has a BRCA1 mutation and personal history of ovarian cancer.        Risk Assessment: Nyella is noted to have a BRCA1 gene mutation. Her lifetime risk of breast cancer is estimated as high as 87%, compared to the general population risk of 12%.     Screening: Based on her lifetime risk of breast cancer estimated at over 20%, we recommend the following based on NCCN screening guidelines:   - clinical breast exam every 6-12 months    - annual screening mammogram with tomosynthesis, last 10/08/21 and  BIRAD 1, negative   - annual breast MRI, last 01/11/22 and BIRAD 1, negative    - Reviewed self breast awareness     While there may be an increased risk for pancreatic cancer, pancreatic cancer screening in the setting of a High Risk GI Clinic is recommended to BRCA1 mutation carriers with a first or second degree relative who has been diagnosed with pancreatic cancer.     Risk Reduction:  We discussed breast cancer risk reducing strategies including lifestyle modifications, chemoprevention and risk reducing mastectomy.     Lifestyle Modifications for Breast Cancer Risk Reduction:  Current recommendations by the [employer]) and the American Cancer Society (ACS) encourage the following recommendations for a healthy lifestyle:   - Maintain a healthy weight including an increase in fruits and vegetables, and limiting high fat and high sugar foods.   - Exercise at least 225 minutes per week of moderate intensity or 150 minutes of high intensity cardiovascular exercise per week and include strength training 2-3 times per week.    - Try and eat whole foods and limit vitamins and supplements and discuss them with your physician.       Dr. Donneta Romberg previously discussed the role of chemoprevention, however these medications are not well studied in unaffected BRCA1 and BRCA2 mutation carriers. We discussed that BRCA1 carriers have a higher risk of hormone negative breast cancer and our traditional chemoprevention agents are anti-hormone pills and may not be as effective in reducing hormone negative breast cancer. We do not know the effect of PARPi on breast cancer risk in BRCA1 carriers although this is being explored.     Dr. Donneta Romberg previous reviewed the option of risk reducing mastectomy in BRCA mutation carriers which can decrease the risk of breast cancer by over 95%. She is interested in this option in the future. We discussed that typically patients are > 1 year out from completion of their surgery for ovarian cancer when they are considered for risk reducing surgery. With the adjuvant PARPi this would typically need to be held for surgery or wait until therapy is completed due to concern for wound healing with surgery. Vedanshi states today that she would like to have prophylactic mastectomy after she has completed her PARPi therapy in February 2025. We discussed today that she may consult with a breast surgeon at anytime to establish care and make a plan and can then schedule surgery at any time in the future. Morghan states that she would like to go ahead and meet with a Metallurgist.       It was a pleasure providing this risk assessment to [name] and we recommend she follow up in September with screening mammogram, as scheduled. Dr. Donneta Romberg can determine when to schedule her next screening breast MRI at this visit.          Dianna Rossetti, APRN-NP

## 2022-04-05 ENCOUNTER — Encounter: Admit: 2022-04-05 | Discharge: 2022-04-05 | Payer: BC Managed Care – PPO

## 2022-04-05 DIAGNOSIS — Z1501 Genetic susceptibility to malignant neoplasm of breast: Secondary | ICD-10-CM

## 2022-04-05 DIAGNOSIS — Z9189 Other specified personal risk factors, not elsewhere classified: Secondary | ICD-10-CM

## 2022-04-05 DIAGNOSIS — I4711 Inappropriate sinus tachycardia: Secondary | ICD-10-CM

## 2022-04-05 DIAGNOSIS — C569 Malignant neoplasm of unspecified ovary: Secondary | ICD-10-CM

## 2022-04-05 DIAGNOSIS — U071 COVID: Secondary | ICD-10-CM

## 2022-04-05 NOTE — Patient Instructions
Thank you for coming to see us today.   Please call our office or send a message through MyChart if you have any questions or concerns.                                                                                                                 Raileigh Sabater, BSN, RN             Clinical Nurse Coordinator for Mary McCarthy, APRN  [email or web address]          [facility]  [address], [id number] (mailing address)  [address], [address] (physical address)  [place], [place] [address]    [facility]  [address]  [place], [place] [address]    [phone] (Nurse Allice Garro)  [phone] (fax)

## 2022-04-08 ENCOUNTER — Encounter: Admit: 2022-04-08 | Discharge: 2022-04-08 | Payer: BC Managed Care – PPO

## 2022-04-09 ENCOUNTER — Encounter: Admit: 2022-04-09 | Discharge: 2022-04-09 | Payer: BC Managed Care – PPO

## 2022-04-09 DIAGNOSIS — Z1501 Genetic susceptibility to malignant neoplasm of breast: Secondary | ICD-10-CM

## 2022-04-09 NOTE — Progress Notes
Appointment Notes:   Future Appointments   Date Time Provider [place]   04/25/2022 11:15 AM PHLEBOTOMIST IN LAB LEVEL 2 CCC2 [place] Exam   04/25/2022 12:00 PM Josph Macho, APRN-NP [facility] [place] Exam   09/13/2022  1:00 PM Payton Doughty, MD [id number] [place] Exam   10/23/2022  1:15 PM MAMMO - IC ROOM 1 [id number] [facility] Radiolog   10/23/2022  2:00 PM Jorge Mandril, MD [place] Exam         Referring Provider: [name], APRN-*    Direct Referral: to Dr. Junious Silk     Location of Films:   IN HOUSE    Contact Summary: Kristina Velazquez has a pathogenic mutation in both BRCA1 and PMS2 genes and a personal history of ovarian cancer. She is being referred by Oletta Cohn for consultation to breast surgeon to discuss risk reducing mastectomy.     01/11/22 bilateral complete High Risk Screening Breast MRI-Overall 1 Negative.  10/08/21 bilateral screening mammogram-Overall 1 Negative.    Allergies reviewed and verified with the patient, and documented in Epic:  Yes

## 2022-04-19 ENCOUNTER — Encounter: Admit: 2022-04-19 | Discharge: 2022-04-19 | Payer: BC Managed Care – PPO

## 2022-04-19 NOTE — Telephone Encounter
Received CT CAP report dated 04/16/2022 from [facility].  Copy kept for physician review and original sent to be scanned into chart.   Impression states: No CT evidence of thoracic metastasis; stable pulmonary nodule in the right lower lobe measuring 1-2 mm since 04/15/2021; post surgical changes from hysterectomy. No pelvic mass or abdominopelvic lymphadenopathy.

## 2022-04-25 ENCOUNTER — Encounter: Admit: 2022-04-25 | Discharge: 2022-04-25 | Payer: BC Managed Care – PPO

## 2022-04-25 DIAGNOSIS — C569 Malignant neoplasm of unspecified ovary: Secondary | ICD-10-CM

## 2022-04-25 DIAGNOSIS — Z5111 Encounter for antineoplastic chemotherapy: Secondary | ICD-10-CM

## 2022-04-25 DIAGNOSIS — K649 Unspecified hemorrhoids: Secondary | ICD-10-CM

## 2022-04-25 DIAGNOSIS — I4711 Inappropriate sinus tachycardia (HCC): Secondary | ICD-10-CM

## 2022-04-25 DIAGNOSIS — Z79890 Hormone replacement therapy (postmenopausal): Secondary | ICD-10-CM

## 2022-04-25 DIAGNOSIS — Z1501 Genetic susceptibility to malignant neoplasm of breast: Secondary | ICD-10-CM

## 2022-04-25 DIAGNOSIS — E8940 Asymptomatic postprocedural ovarian failure: Secondary | ICD-10-CM

## 2022-04-25 DIAGNOSIS — B3731 Vaginal candidiasis: Secondary | ICD-10-CM

## 2022-04-25 DIAGNOSIS — U071 COVID: Secondary | ICD-10-CM

## 2022-04-25 LAB — COMPREHENSIVE METABOLIC PANEL
ALBUMIN: 4.4 g/dL (ref 3.5–5.0)
ALK PHOSPHATASE: 52 U/L (ref 25–110)
ALT: 22 U/L (ref 7–56)
ANION GAP: 5 K/UL (ref 3–12)
AST: 18 U/L (ref 7–40)
BLD UREA NITROGEN: 9 mg/dL (ref 7–25)
CALCIUM: 9.4 mg/dL — ABNORMAL HIGH (ref 8.5–10.6)
CHLORIDE: 105 MMOL/L (ref 98–110)
CO2: 29 MMOL/L (ref 21–30)
CREATININE: 0.9 mg/dL (ref 0.4–1.00)
EGFR: >60 mL/min (ref >60)
GLUCOSE,PANEL: 87 mg/dL (ref 70–100)
SODIUM: 139 MMOL/L (ref 137–147)
TOTAL BILIRUBIN: 0.9 mg/dL (ref 0.3–1.2)
TOTAL PROTEIN: 7.2 g/dL (ref 6.0–8.0)

## 2022-04-25 LAB — CBC AND DIFF
ABSOLUTE BASO COUNT: 0.1 K/UL (ref 0–0.20)
ABSOLUTE EOS COUNT: 0.2 K/UL (ref 0–0.45)
ABSOLUTE MONO COUNT: 0.6 K/UL (ref 0–0.80)
WBC COUNT: 7.6 K/UL (ref 4.5–11.0)

## 2022-04-25 LAB — CA125: CA-125: 8 U/mL (ref <35)

## 2022-04-25 MED ORDER — FLUCONAZOLE 150 MG PO TAB
150 mg | ORAL_TABLET | Freq: Once | ORAL | 0 refills | 3.00000 days | Status: AC
Start: 2022-04-25 — End: ?

## 2022-04-25 NOTE — Progress Notes
Subjective     GYNECOLOGIC ONCOLOGY EVALUATION    Name:Kristina Velazquez    Date: 04/25/2022    Referring & Primary Care Physician: Jerene Dilling    Chief Complaint:   Chief Complaint   Patient presents with    Heme/Onc Care     History of Present Illness:    Kristina Velazquez is a 32 y.o. female with below history     Onc Timeline Overview Note   Kristina Velazquez is a 32 y.o. female patient of Dr. 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 Here today after starting Olaparib 300mg  BID on 03/03/21. Here for follow up.        She reports adherence to her Olaparib, with an exception of one weekend when she forgot to bring her medication. She Velazquez any new health issues since her last visit. She reports feeling great with no significant fatigue, an improvement from her last visit when she was feeling unwell. She also reports an improvement in hot flashes since the increase in her estrogen patch.    She has been experiencing some gastrointestinal issues, specifically related to hemorrhoids. Despite increasing her fiber intake and using preparation wipes and creams, she continues to have discomfort. She Velazquez constipation, reporting two to three bowel movements a day. She has intermittent problems with hemorrhoids for the last 1.5 years or so.     She also mentions a recent increase in vaginal discharge, which she describes as white but not associated with itching or burning. She Velazquez any abdominal or pelvic pain. Her recent CT scan showed no concerns. She gets her labs done monthly at her workplace, a hospital in [place].     Patient reports her appetite is good, she is hydrating well and her energy level is good. She Velazquez chest pain, SOA, cough, abdominal pain/bloating, nausea/vomiting, constipation/diarrhea, hematuria, hematochezia, vaginal bleeding/discharge, urinary frequency/urgency/dysuria, fevers/chills.        Reproductive History    Menarche: Age 74  Number of pregnancies: None   Oral Contraceptive Use:  Yes; Years: Oral birth control pills used for approximately eight to nine years.      Menopause Status Postmenopausal  Age of last period: 44 (surgical)  Gynecologic Surgical History:   11/14/20 hysterectomy and bilateral salpingo oophorectomy by Dr. [name] on 11/14/20.   Hormone Replacement Therapy Use:  Yes Estrogen pills started in 01/2021 and is currently using.    Years: started 01/2021   Last dose: currently using      Family History of Cancer:   Kristina Velazquez has a personal history of ovarian cancer that was diagnosed in 07/2020.  She had a hysterectomy and bilateral salpingo oophorectomy in 10/2020 by Dr. [name]. Kristina Velazquez received six cycles of Carbo/Taxol beginning in 08/2020 and ending in 12/2020. Dr. [name] prescribed Olaparib and Anjoli started this on 03/03/21.  Mother was diagnosed with unilateral breast cancer at 32 years of age and with ovarian cancer at 32 years of age. She passed away at 50 years of age.   Father was diagnosed with kidney cancer at 32 years of age.   Paternal grandmother was diagnosed with breast cancer at 32 years of age.     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    Mother Kristina Velazquez states that her mother's genetic test result also was positive for a BRCA1 genetic mutation. She will bring a copy of her Mother's genetic test result with her to the appointment.    Sister, Kristina Velazquez states that Kristina Velazquez's genetic test result was negative.      Breast Health History & Imaging:  Date Test Results   None Kristina Velazquez did not have prior breast imaging or surgeries.       RISK MODELS   Tyrer Cuzick:    10 Year Risk: 11% 10 Year Population Risk: 0.5%    Lifetime Risk: 58.7% Lifetime Population Risk: 13.3%                *Tyrer-Cuzick Score reflects preliminary calculations. The final Tyrer-Cuzick Score will be recorded by your Provider at the consultation.     GAIL Scale:    Kristina Velazquez has BRCA1 and PMS2 genetic mutations so the [facility] was not completed.          Ovarian cancer (HCC)   08/08/2020 Imaging    CT scan A/P demonstrated: Ill-defined complex pelvic mass with omental and mesenteric masses with intra-abdominal ascites suggesting a neoplastic process with metastatic disease. No other intraparenchymal metastases    CA125 = 862     08/16/2020 Biopsy    High grade serous carcinoma. ER 85%, PR 10%, positive PAX8, WT-1, p53     09/04/2020 - 01/24/2021 Chemotherapy    Had reaction to both taxol and carbo in C1, plan for inpatient C2  OP GYN CARBOPLATIN (desensitization) + PACLITAXEL (desensitization) (OVARIAN)  Plan Provider: Eileen Stanford, MD  Treatment goal: Curative   Line of treatment: First Line     10/10/2020 Pertinent Labs    Caris:  Genomic LOH high  BRCA negative  MSI stable  CCNE1 not amplified  Her 2 neg    Pt would be candidate for PARP maintenance       10/25/2020 Imaging    CT CAP: no disease in chest. Decrease in size of the bilateral adnexal masses, peritoneal metastases, and malignant ascites suggesting favorable response to therapy by the patient's metastatic ovarian cancer.      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  Interval hysterectomy, omentectomy and tumor debulking. Minimal residual areas of peritoneal thickening and nodularity, indeterminate and may be postoperative scarring or from minute residual/recurrent tumor. No   ascites.   2. No new abdominopelvic lymphadenopathy.      02/23/2021 -  Chemotherapy    OP GYN OLAPARIB  Plan Provider: Eileen Stanford, MD  Treatment goal: Control  Line of treatment: Maintenance     03/2022 Imaging      Received CT CAP report dated 04/16/2022 from [facility].    Impression states: No CT evidence of thoracic metastasis; stable pulmonary nodule in the right lower lobe  measuring 1-2 mm since 04/15/2021; post surgical changes from hysterectomy. No pelvic mass or abdominopelvic lymphadenopathy.       Reproductive History  Menstrual Hx               LMP: 07/22/20               Having Periods:  Yes               Age at first period: 54     Pregnancy Hx               Number of pregnancies: None               Number of live births:                Age of first live birth:                Did you breastfeed: No                            If Yes, how long?                Oral Birth Control:  Yes               Years: 8-9 years               Infertility Medication:  No               Year/Med Name:      Menopausal Hx               Age of last period: N/A               Hormone Replacement Therapy:                 Years:      Health Maintenence  Last Pap: 2021  Abn History of Pap: Velazquez  Colonoscopy: Velazquez  Mammogram: Velazquez  Bone scan: Velazquez     Advanced Directives   DPOA: No  Living Will: No     Past Medical History:  Medical History:   Diagnosis Date    COVID 01/29/2020    Inappropriate sinus tachycardia (HCC)     Ovarian cancer (HCC)      Past Surgical History:  Surgical History:   Procedure Laterality Date    HX SHOULDER SURGERY Right 2008    HX WISDOM TEETH EXTRACTION  2014    placement of port-a-cath with fluoroscopy Left 08/25/2020    Performed by Freund, Alecia Lemming., MD at [facility] OR    FLUOROSCOPIC GUIDANCE CENTRAL VENOUS ACCESS DEVICE PLACEMENT/ REPLACEMENT/ REMOVAL Left 08/25/2020    Performed by Freund, Alecia Lemming., MD at [facility] OR    ESOPHAGOGASTRODUODENOSCOPY WITH SPECIMEN COLLECTION BY BRUSHING/ WASHING N/A 11/13/2020    Performed by Comer Locket, MD at [facility] ENDO    COLONOSCOPY DIAGNOSTIC WITH SPECIMEN COLLECTION BY BRUSHING/ WASHING - FLEXIBLE N/A 11/13/2020    Performed by Comer Locket, MD at [facility] ENDO    TOTAL ABDOMINAL HYSTERECTOMY AND BILATERAL SALPINGO-OOPHORECTOMY WITH OMENTECTOMY AND RADICAL RESECTION FOR DEBULKING, APPENDECTOMY, Bilateral 11/14/2020    Performed by Eileen Stanford, MD at [facility] OR    Cytoreductive surgery; largest tumor approx. 6 cm Hyperthermic Intraperitoneal chemotherapy  N/A 11/14/2020    Performed by Dia Crawford, MD at [facility] OR    CHEMO CART  HX APPENDECTOMY      HX CHOLECYSTECTOMY      HX HYSTERECTOMY      OVARY SURGERY      TUNNELED VENOUS PORT PLACEMENT       Medications:    Current Outpatient Medications:     amphetamine-dextroamphetamine XR (ADDERALL XR) 30 mg capsule, Take one capsule by mouth twice daily., Disp: , Rfl:     diphenhydrAMINE hcl (BENADRYL ALLERGY) 25 mg tablet, Take one tablet to two tablets by mouth at bedtime as needed., Disp: , Rfl:     estradioL (VIVELLE-DOT) 0.0375 mg/24 hr patch, Apply one patch to top of skin as directed twice weekly., Disp: 8 patch, Rfl: 3    fluconazole (DIFLUCAN) 150 mg tablet, Take one tablet by mouth once for 1 dose., Disp: 1 tablet, Rfl: 0    lidocaine/prilocaine (EMLA) 2.5/2.5 % topical cream, Apply  topically to affected area as Needed. Apply 30-60 minutes prior to port access., Disp: 30 g, Rfl: 0    olaparib (LYNPARZA) 150 mg tablet, Take two tablets by mouth twice daily., Disp: 120 tablet, Rfl: 3    ondansetron HCL (ZOFRAN) 8 mg tablet, Take 1 tablet by mouth twice a day 30 minutes prior to each Olaparib dose.  Indications: prevent nausea and vomiting from cancer chemotherapy, Disp: 60 tablet, Rfl: 3    Allergies:  Allergies   Allergen Reactions    Carboplatin CHEST TIGHTNESS, FLUSHING (SKIN), HEADACHE, RASH, SHORTNESS OF BREATH, SEE COMMENTS and REDNESS     Did not complete the remainder of the infusion.  Additional reaction 01/24/21 during bag A, rash located neck, chest and back of ears, dyspnea, hypertension with BP 140/94, tachycardia with HR 138 and chest tightness.  Completed the remainder of the infusion.    Hydrocodone STOMACH UPSET and VOMITING    Paclitaxel CHEST TIGHTNESS, VISION CHANGES and REDNESS    Peanut RASH     Social History:  Social History     Socioeconomic History    Marital status: Married   Tobacco Use    Smoking status: Never    Smokeless tobacco: Never   Vaping Use    Vaping status: Never Used   Substance and Sexual Activity    Alcohol use: Yes     Alcohol/week: 1.0 standard drink of alcohol     Types: 1 Glasses of wine per week     Comment: OCASSIONALLY     Drug use: Never    Sexual activity: Yes     Partners: Male     Birth control/protection: Post-menopausal     Family History:  Family History   Problem Relation Age of Onset    Genetic Disorder Mother         BRCA1+    Cancer-Breast Mother 62    Cancer-Ovarian Mother 80    Heart Disease Mother     Depression Mother     Kidney Cancer Father 20    Hypertension Father     None Reported Sister     None Reported Brother     Unknown to Patient Paternal Uncle     Hypertension Maternal Grandmother     Arthritis-osteo Maternal Grandmother     Migraines Maternal Grandmother     Depression Maternal Grandmother     Heart Disease Maternal Grandfather     Hypertension Maternal Grandfather     Cancer-Breast Paternal Grandmother 10    Hypertension Paternal Grandfather     Stroke Paternal Grandfather      REVIEW OF SYSTEMS:  CONSTITUTIONAL: Negative unless stated in HPI  EYES: Negative unless stated in HPI  ENT: Negative unless stated in HPI  RESPIRATORY: Negative unless stated in HPI  CARDIOVASCULAR: Negative unless stated in HPI  GI: Negative unless stated in HPI  GU: Negative unless stated in HPI  MUSCULO-SKELETAL: Negative unless stated in HPI  SKIN: Negative unless stated in HPI  ENDOCRINE: Negative unless stated in HPI  HEMATOLOGIC: Negative unless stated in HPI    ECOG 1    Physical Exam:    BP 127/84 (BP Source: Arm, Right Upper, Patient Position: Sitting)  - Pulse 90  - Temp (!) 35.9 ?C (96.6 ?F) (Temporal)  - Resp 18  - Wt 103.1 kg (227 lb 6.4 oz)  - LMP 08/17/2020  - SpO2 100%  - BMI 35.35 kg/m?   GENERAL APPEARANCE: Appears healthy.  Alert; in no acute distress.  Pleasant.  HEENT: Unremarkable. No tenderness or masses noted.  LUNGS: Chest symmetrical. Good diaphragmatic excursion. Lungs clear; normal breath sounds.  CARDIOVASCULAR:  RRR. Heart sounds normal.      ABDOMEN: Abdomen soft, non-tender.  No masses, organomegaly, or hernia. No clinical evidence of ascites.  Well healed vertical incision   PELVIC: External genitalia,urethral meatus, urethra, bladder and vagina normal. Cervix, uterus, adnexae surgically absent.  Anus and perineum normal. Bimanual  without masses or nodularity on the pelvic floor. Exam chaperoned by nurse  EXTREMITIES: Extremities normal. No joint deformities, edema, or skin discoloration. Station and gait normal.  SKIN: Skin color, texture, turgor normal. No rashes or lesions.  LYMPH NODES: No palpable lymph nodes          CBC w/Diff    Lab Results   Component Value Date/Time    WBC 7.6 04/25/2022 11:56 AM    RBC 4.38 04/25/2022 11:56 AM    HGB 14.5 04/25/2022 11:56 AM    HCT 41.9 04/25/2022 11:56 AM    MCV 95.6 04/25/2022 11:56 AM    MCH 33.1 04/25/2022 11:56 AM    MCHC 34.6 04/25/2022 11:56 AM    RDW 15.7 (H) 04/25/2022 11:56 AM    PLTCT 308 04/25/2022 11:56 AM    MPV 7.2 04/25/2022 11:56 AM    Lab Results   Component Value Date/Time    NEUT 63 04/25/2022 11:56 AM    ANC 4.80 04/25/2022 11:56 AM    LYMA 25 04/25/2022 11:56 AM    ALC 1.90 04/25/2022 11:56 AM    MONA 8 04/25/2022 11:56 AM    AMC 0.60 04/25/2022 11:56 AM    EOSA 3 04/25/2022 11:56 AM    AEC 0.20 04/25/2022 11:56 AM    BASA 1 04/25/2022 11:56 AM    ABC 0.10 04/25/2022 11:56 AM          Comprehensive Metabolic Profile    Lab Results   Component Value Date/Time    NA 139 04/25/2022 11:56 AM    K 4.4 04/25/2022 11:56 AM    CL 105 04/25/2022 11:56 AM    CO2 29 04/25/2022 11:56 AM    GAP 5 04/25/2022 11:56 AM    BUN 9 04/25/2022 11:56 AM    CR 0.99 04/25/2022 11:56 AM    GLU 87 04/25/2022 11:56 AM    Lab Results   Component Value Date/Time    CA 9.4 04/25/2022 11:56 AM    PO4 3.7 11/17/2020 04:09 AM    ALBUMIN 4.4 04/25/2022 11:56 AM    TOTPROT 7.2 04/25/2022 11:56 AM    ALKPHOS 52 04/25/2022 11:56 AM  AST 18 04/25/2022 11:56 AM    ALT 22 04/25/2022 11:56 AM    TOTBILI 0.9 04/25/2022 11:56 AM          Other labs    No results found for: ESR No results found for: LDH     CA-125   Date Value Ref Range Status   01/11/2022 10 <35 U/ml Final   10/08/2021 8 <35 U/ml Final   07/06/2021 8 <35 U/ml Final   03/27/2021 10 <35 U/ml Final   02/08/2021 14 <35 U/ml Final           ASSESSMENT/PLAN:  Kristina Velazquez is a 32 y.o. 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 Now on maintenance Olaparib 300mg  BID. Here for 3 month follow up.     No evidence of disease on exam.   Compliant with Olaparib 300mg  BID.   CT imaging reviewed from OSH:  No CT evidence of thoracic metastasis; stable pulmonary nodule in the right lower lobe measuring 1-2 mm since 04/15/2021; post surgical changes from hysterectomy. No pelvic mass or abdominopelvic lymphadenopathy.  CA125 is pending, last was WNL at 10.    CBC and CMP stable.  Vaginal Discharge: Moderate amount of white discharge, consistent with yeast.          -Send one tablet of Diflucan to home pharmacy.  Hemorrhoids: Ongoing issues despite increased fiber intake and use of creams and wipes. No constipation.         -Consider adding Metamucil to daily regimen.         -Consider consultation with GI or colorectal specialist if symptoms persist or worsen.  Fatigue- resolved  Vasomotor symptoms of menopause -- Improved. Continue Estradiol 1mg  daily.     Discussed possible symptoms of cancer recurrence, such as cough, chest pain, early satiety, abdominal pain/bloating, N/V, vaginal bleeding/discharge or change in bowel/bladder habits, dizziness, HA.    continue with the plan of 2 years of maintenance therapy.  Port flushes here with return visits.   RTC with CBC w/diff, CMP, CA125 in 3 months, or sooner if new symptoms arise.       Kristina Pilling, APRN-NP    ATTESTATION  Documentation for this visit were assisted by [employer] via audio recorded by Lanell Persons, APRN 04/25/2022 2:22 PM     Total Time Today was 30 minutes in the following activities: Preparing to see the patient, Obtaining and/or reviewing separately obtained history, Performing a medically appropriate examination and/or evaluation, Counseling and educating the patient/family/caregiver, Ordering medications, tests, or procedures, Documenting clinical information in the electronic or other health record, Independently interpreting results (not separately reported) and communicating results to the patient/family/caregiver, and Care coordination (not separately reported)

## 2022-05-09 IMAGING — CT CT angio chest PE protocol
3 of 7 series · 19 of 36 positions shown · IV contrast (CONT)
Comparison: CT chest postcontrast 10/25/2020

STUDY TYPE: CT angiogram chest with pulmonary embolism protocol
TECHNIQUE: After intravenous administration of IV contrast medium, postcontrast CT angiogram of the chest was performed with pulmonary embolism protocol. Coronal and sagittal reformatted and 3D MIP images were generated.
HISTORY: Chest pain, left-sided substernal chest pain last night, worsening with elevated D-dimer, history of ovarian cancer on chemotherapy

[Series 6: pe chest · axial · 0.73mm/px · z∈[-237,+88]mm · 11 of 156 slices shown]
[im 13/156  lung]
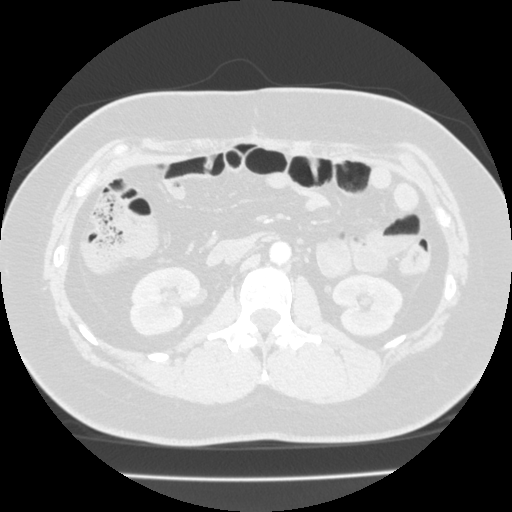
[im 26/156  mediastinal]
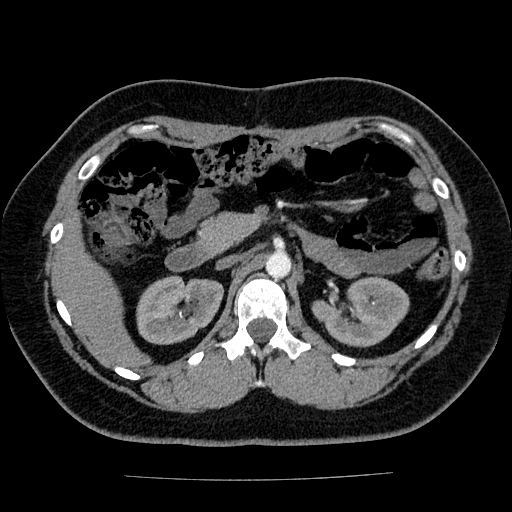
[im 39/156  lung]
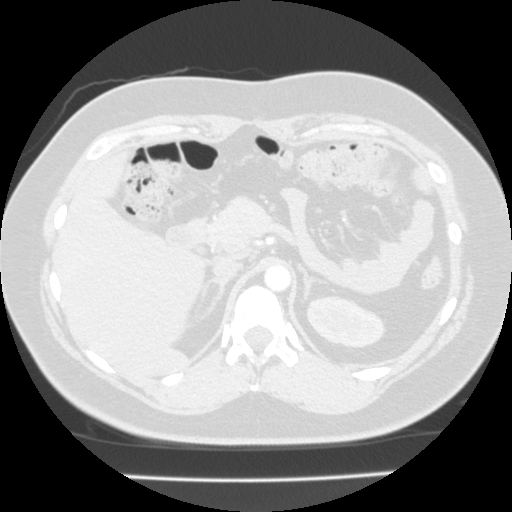
[im 52/156  mediastinal]
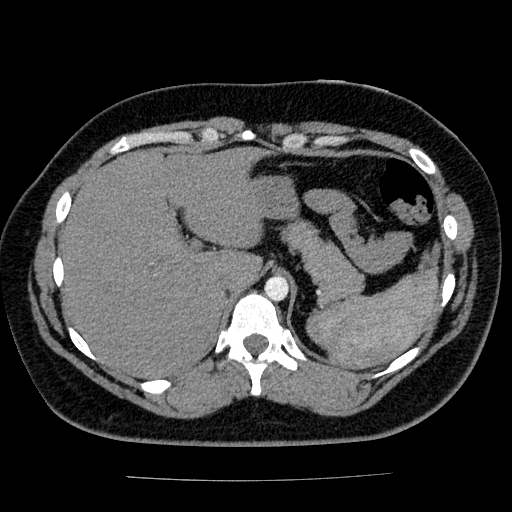
[im 65/156  lung]
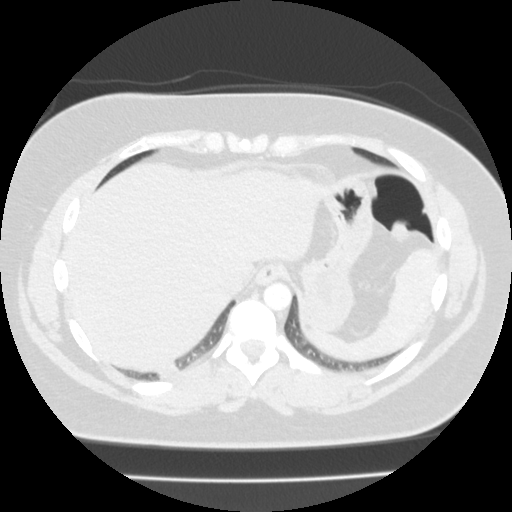
[im 78/156  mediastinal]
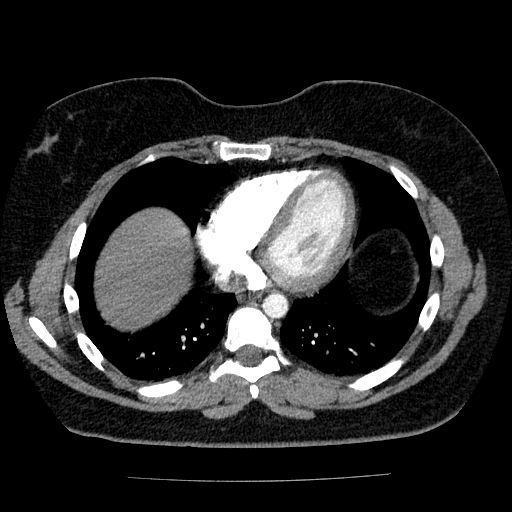
[im 91/156  lung]
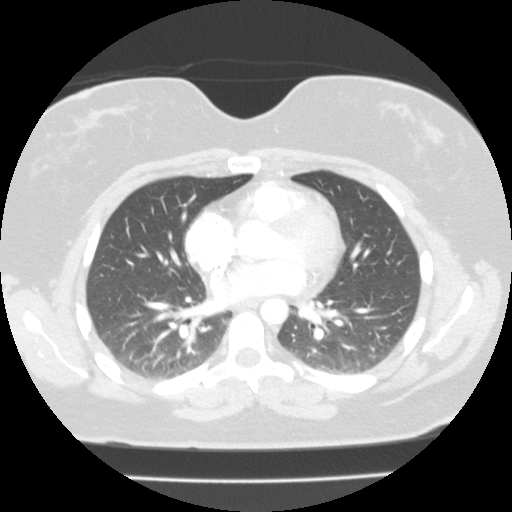
[im 104/156  mediastinal]
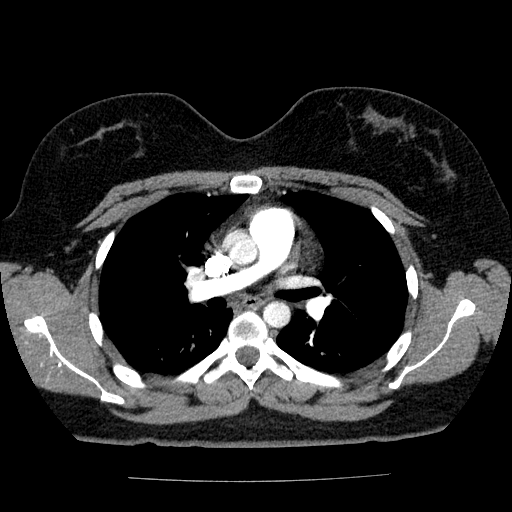
[im 117/156  lung]
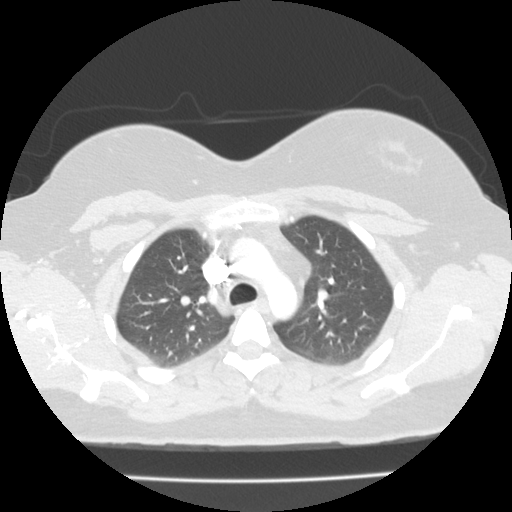
[im 130/156  mediastinal]
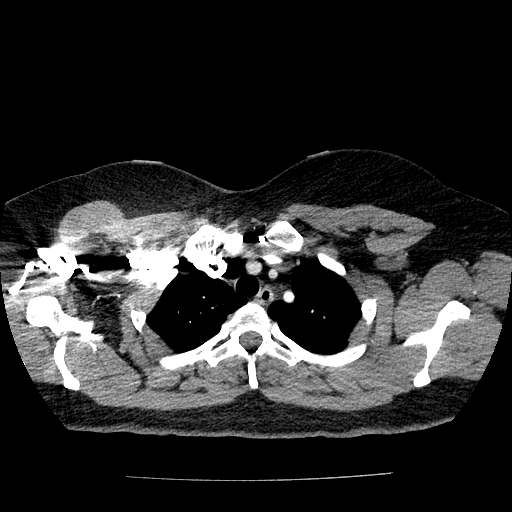
[im 143/156  lung]
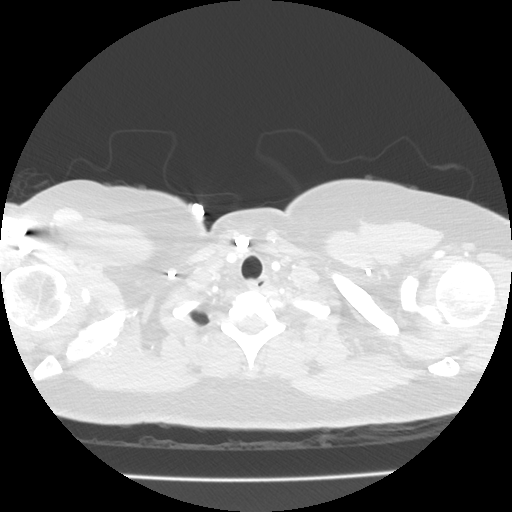

[Series 8: lung · axial · 0.73mm/px · z∈[-239,+31]mm · 7 of 156 slices shown]
[im 12/156  mediastinal]
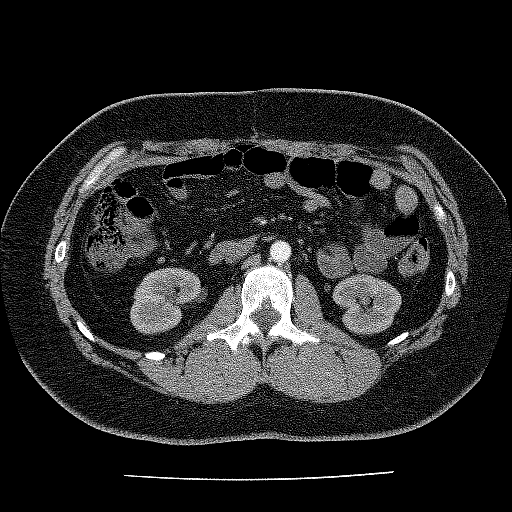
[im 36/156  mediastinal]
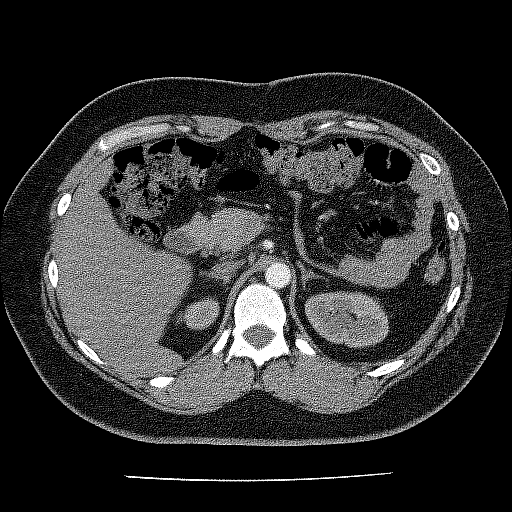
[im 48/156  mediastinal]
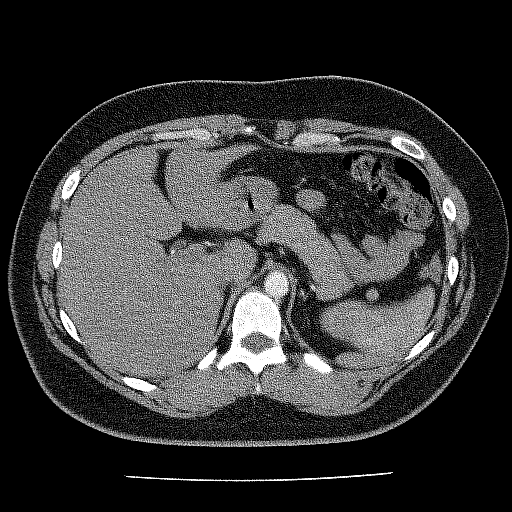
[im 72/156  mediastinal]
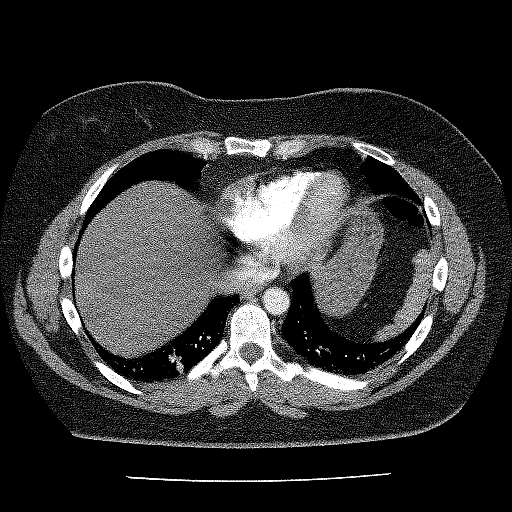
[im 84/156  mediastinal]
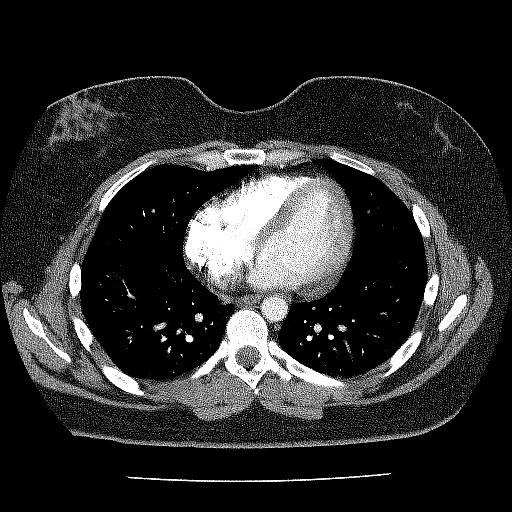
[im 108/156  mediastinal]
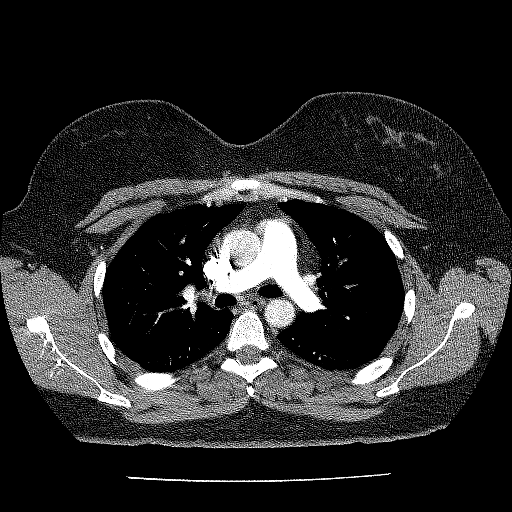
[im 120/156  mediastinal]
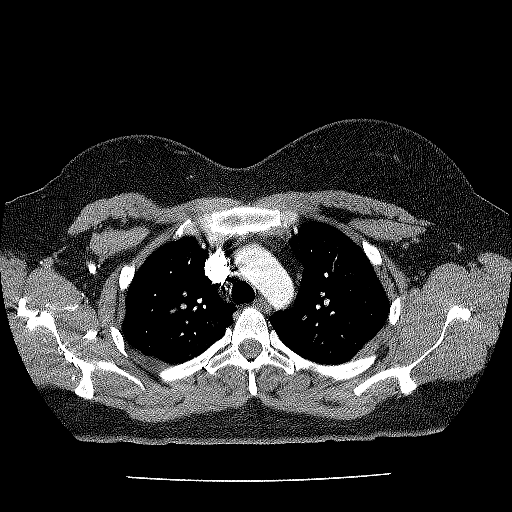

[Series 1001: coronal st w/ · coronal · 0.76mm/px · 1 of 137 slices shown]
[im 69/137  mediastinal]
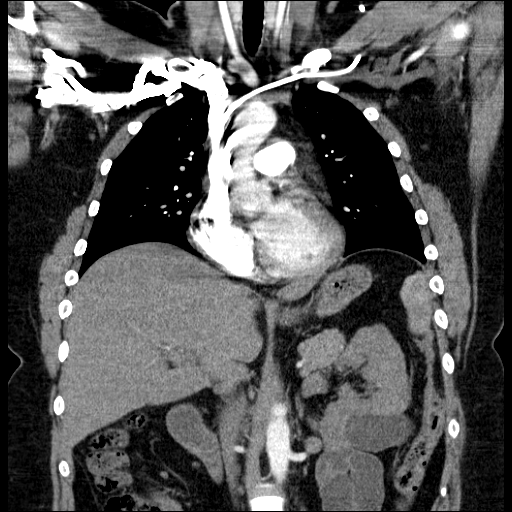

[19 of 36 positions shown; findings below may reference images not displayed]

FINDINGS: There is mild motion artifact. Bolus quality is adequate. No filling defects are seen within the pulmonary arterial system to the level of the proximal segmental pulmonary arteries. Main pulmonary artery is nondilated. Thoracic aorta is nonaneurysmal. Heart size is not significantly enlarged there is no pericardial effusion or suspicious thoracic adenopathy. Limited images through the upper abdomen demonstrate post cholecystectomy change in probable fatty liver.
Review of lung windows demonstrates motion artifact and linear discoid atelectasis. There is probable more focal atelectasis at the right posterior basilar lung base. No pleural effusion or pneumothorax. Central airway is patent. No obvious pulmonary mass although there is significant motion artifact. There is no suspicious osseous abnormality.
IMPRESSION: 1. No CT evidence of pulmonary embolism.
2. Motion artifact.
3. Bibasilar discoid atelectasis with more focal atelectasis right lung base. Follow-up recommended.

## 2022-05-15 ENCOUNTER — Encounter: Admit: 2022-05-15 | Discharge: 2022-05-15 | Payer: BC Managed Care – PPO

## 2022-05-16 ENCOUNTER — Encounter: Admit: 2022-05-16 | Discharge: 2022-05-16 | Payer: BC Managed Care – PPO

## 2022-05-16 MED FILL — OLAPARIB 150 MG PO TAB: 150 mg | ORAL | 15 days supply | Qty: 60 | Fill #7 | Status: AC

## 2022-05-21 ENCOUNTER — Encounter: Admit: 2022-05-21 | Discharge: 2022-05-21 | Payer: BC Managed Care – PPO

## 2022-05-23 ENCOUNTER — Encounter: Admit: 2022-05-23 | Discharge: 2022-05-23 | Payer: BC Managed Care – PPO

## 2022-05-23 DIAGNOSIS — R232 Flushing: Secondary | ICD-10-CM

## 2022-05-23 DIAGNOSIS — C569 Malignant neoplasm of unspecified ovary: Secondary | ICD-10-CM

## 2022-05-23 DIAGNOSIS — E8940 Asymptomatic postprocedural ovarian failure: Secondary | ICD-10-CM

## 2022-05-23 DIAGNOSIS — Z79890 Hormone replacement therapy (postmenopausal): Secondary | ICD-10-CM

## 2022-05-23 LAB — COMPREHENSIVE METABOLIC PANEL

## 2022-05-23 LAB — CBC AND DIFF

## 2022-05-23 MED ORDER — LYLLANA 0.0375 MG/24 HR TD PTSW
2 refills
Start: 2022-05-23 — End: ?

## 2022-05-23 NOTE — Telephone Encounter
Received incoming fax from [facility] lab of patient's most recent labs drawn today, 05/23/22. Will have Dr. [name] and team review. Copy of labs scanned to patient's chart.

## 2022-05-24 ENCOUNTER — Encounter: Admit: 2022-05-24 | Discharge: 2022-05-24 | Payer: BC Managed Care – PPO

## 2022-05-27 ENCOUNTER — Encounter: Admit: 2022-05-27 | Discharge: 2022-05-27 | Payer: BC Managed Care – PPO

## 2022-05-27 DIAGNOSIS — R232 Flushing: Secondary | ICD-10-CM

## 2022-05-27 DIAGNOSIS — Z79890 Hormone replacement therapy (postmenopausal): Secondary | ICD-10-CM

## 2022-05-27 DIAGNOSIS — E8940 Asymptomatic postprocedural ovarian failure: Secondary | ICD-10-CM

## 2022-05-27 DIAGNOSIS — C569 Malignant neoplasm of unspecified ovary: Secondary | ICD-10-CM

## 2022-05-27 MED ORDER — ESTRADIOL 0.0375 MG/24 HR TD PTSW
1 | MEDICATED_PATCH | TRANSDERMAL | 3 refills | 30.00000 days | Status: AC
Start: 2022-05-27 — End: ?

## 2022-05-27 MED FILL — OLAPARIB 150 MG PO TAB: 150 mg | ORAL | 15 days supply | Qty: 60 | Fill #8 | Status: AC

## 2022-05-30 ENCOUNTER — Encounter: Admit: 2022-05-30 | Discharge: 2022-05-30 | Payer: BC Managed Care – PPO

## 2022-06-04 ENCOUNTER — Encounter: Admit: 2022-06-04 | Discharge: 2022-06-04 | Payer: BC Managed Care – PPO

## 2022-06-04 DIAGNOSIS — C569 Malignant neoplasm of unspecified ovary: Secondary | ICD-10-CM

## 2022-06-04 MED ORDER — LYNPARZA 150 MG PO TAB
300 mg | ORAL_TABLET | Freq: Two times a day (BID) | ORAL | 3 refills
Start: 2022-06-04 — End: ?

## 2022-06-06 ENCOUNTER — Encounter: Admit: 2022-06-06 | Discharge: 2022-06-06 | Payer: BC Managed Care – PPO

## 2022-06-06 NOTE — Progress Notes
Attempted to call [name] to perform reassessment for oral chemotherapy medication.  No answer. Left voicemail asking patient to return call to the pharmacist at [phone].       Onalee Hua, [facility]  Oncology Clinical Pharmacist  06/06/2022

## 2022-06-07 ENCOUNTER — Encounter: Admit: 2022-06-07 | Discharge: 2022-06-07 | Payer: BC Managed Care – PPO

## 2022-06-10 ENCOUNTER — Encounter: Admit: 2022-06-10 | Discharge: 2022-06-10 | Payer: BC Managed Care – PPO

## 2022-06-10 NOTE — Progress Notes
Attempted to call Kristina Velazquez to perform reassessment for oral chemotherapy medication.  No answer. Left voicemail asking patient to return call to the pharmacist at [phone].       Oswaldo Milian, [facility]  Oncology Clinical Pharmacist PGY-2 Resident  06/10/2022

## 2022-06-11 ENCOUNTER — Encounter: Admit: 2022-06-11 | Discharge: 2022-06-11 | Payer: BC Managed Care – PPO

## 2022-06-12 ENCOUNTER — Encounter: Admit: 2022-06-12 | Discharge: 2022-06-12 | Payer: BC Managed Care – PPO

## 2022-06-12 NOTE — Oral Chemotherapy Note
Name:Kristina Velazquez           MRN: [id number]                 DOB:12-03-1990          Age: 32 y.o.  Date of Service: 06/12/22      Oral Chemotherapy Refill Review     Patient Information:     Correct patient/DOB/Diagnosis: Yes    Reviewed most recent clinic notes for changes in plan: Yes    Most recent labs reviewed: Yes    Appointment Information:   Date of last appointment:  04/25/22    Follow up appointment scheduled : Yes 08/09/22      Medication Information:     Medication name: Olaparib    Medication in [place] Plan: Yes     Date of last refill released from [facility]: 11/2021    Weight change >10%: No    Has a dose adjustment been made since last refill?No    Is Pharmacy listed in [place] treatment plan? Yes    Pharmacy: [facility]    Consent Date 01/2021    Patient Contact  Method of communication: MyChart  Confirmed need for refill: Yes  Patient taking medication as directed Yes  Confirmation of receiving pharmacy: Yes  Patient is anticipating a start date of: 5/20    Other Information:     Plan was routed to Scarlette Shorts, APRN

## 2022-06-12 NOTE — Progress Notes
Oral Chemotherapy Pharmacist Reassessment Note    Summary of Therapy  Kristina Velazquez continues olaparib for the treatment of ovarian cancer.  Cycle 1 start date was 03/03/21. Kristina Velazquez confirms she is taking the medication as prescribed - 2 150 mg tablets by mouth twice daily.     Adverse Effects and Adherence Assessment   Kristina Velazquez is not experiencing any significant adverse effects to this medication regimen.     The patient's ability to self-administer medication was re-assessed. The patient has the ability to self-administer this medication.  Kristina Velazquez reports missing 5 doses in the past 3 month(s) of therapy. She uses alarms on her phone as reminders to take the olaparib.   The patient?s provider has been notified of the patient?s difficulty with adherence so that education can take place at their clinic visits as well.     Dose Appropriateness Assessment  No renal or hepatic dose adjustment is required at this time No dose adjustments based on toxicity are required at this time and treatment will continue until progression or unacceptable toxicity.        CBC w diff    Lab Results   Component Value Date/Time    WBC 7.6 04/25/2022 11:56 AM    RBC 4.38 04/25/2022 11:56 AM    HGB 14.5 04/25/2022 11:56 AM    HCT 41.9 04/25/2022 11:56 AM    MCV 95.6 04/25/2022 11:56 AM    MCH 33.1 04/25/2022 11:56 AM    MCHC 34.6 04/25/2022 11:56 AM    RDW 15.7 (H) 04/25/2022 11:56 AM    PLTCT 308 04/25/2022 11:56 AM    MPV 7.2 04/25/2022 11:56 AM    Lab Results   Component Value Date/Time    NEUT 63 04/25/2022 11:56 AM    ANC 4.80 04/25/2022 11:56 AM    LYMA 25 04/25/2022 11:56 AM    ALC 1.90 04/25/2022 11:56 AM    MONA 8 04/25/2022 11:56 AM    AMC 0.60 04/25/2022 11:56 AM    EOSA 3 04/25/2022 11:56 AM    AEC 0.20 04/25/2022 11:56 AM    BASA 1 04/25/2022 11:56 AM    ABC 0.10 04/25/2022 11:56 AM        Comprehensive Metabolic Profile    Lab Results   Component Value Date/Time    NA 139 04/25/2022 11:56 AM    K 4.4 04/25/2022 11:56 AM    CL 105 04/25/2022 11:56 AM    CO2 29 04/25/2022 11:56 AM    GAP 5 04/25/2022 11:56 AM    BUN 9 04/25/2022 11:56 AM    CR 0.99 04/25/2022 11:56 AM    GLU 87 04/25/2022 11:56 AM    Lab Results   Component Value Date/Time    CA 9.4 04/25/2022 11:56 AM    PO4 3.7 11/17/2020 04:09 AM    ALBUMIN 4.4 04/25/2022 11:56 AM    TOTPROT 7.2 04/25/2022 11:56 AM    ALKPHOS 52 04/25/2022 11:56 AM    AST 18 04/25/2022 11:56 AM    ALT 22 04/25/2022 11:56 AM    TOTBILI 0.9 04/25/2022 11:56 AM            Creatinine clearance cannot be calculated (Patient's most recent lab result is older than the maximum 30 days allowed.)    Medication Reconciliation and Drug/Food Interaction Assessment  A comprehensive medication reconciliation was performed for Kristina Velazquez. her medication list was updated to reflect any identified changes and the patient?s current medication list is included  below. Kristina Velazquez was instructed to speak with her healthcare provider and/or the oral chemotherapy pharmacist before starting any new drug, including, but not limited to, prescription, over the counter, natural / herbal products, or vitamins and supplements.     Drug-drug and drug-food interactions were assessed between the patients? specialty medication(s) and her most current medication list. No significant drug-drug or drug-food interactions were identified.     Home Medications    Medication Sig   amphetamine-dextroamphetamine XR (ADDERALL XR) 30 mg capsule Take one capsule by mouth twice daily.   diphenhydrAMINE hcl (BENADRYL ALLERGY) 25 mg tablet Take one tablet to two tablets by mouth at bedtime as needed.   estradioL (VIVELLE-DOT) 0.0375 mg/24 hr patch Apply one patch to top of skin as directed twice weekly.   lidocaine/prilocaine (EMLA) 2.5/2.5 % topical cream Apply  topically to affected area as Needed. Apply 30-60 minutes prior to port access.   olaparib (LYNPARZA) 150 mg tablet Take two tablets by mouth twice daily.   ondansetron HCL (ZOFRAN) 8 mg tablet Take 1 tablet by mouth twice a day 30 minutes prior to each Olaparib dose.  Indications: prevent nausea and vomiting from cancer chemotherapy       Allergies   Allergen Reactions    Carboplatin CHEST TIGHTNESS, FLUSHING (SKIN), HEADACHE, RASH, SHORTNESS OF BREATH, SEE COMMENTS and REDNESS     Did not complete the remainder of the infusion.  Additional reaction 01/24/21 during bag A, rash located neck, chest and back of ears, dyspnea, hypertension with BP 140/94, tachycardia with HR 138 and chest tightness.  Completed the remainder of the infusion.    Hydrocodone STOMACH UPSET and VOMITING    Paclitaxel CHEST TIGHTNESS, VISION CHANGES and REDNESS    Peanut RASH       Response to Therapy Assessment  The electronic medical record for Kristina Velazquez has been reviewed. The patient?s provider has determined this therapy is appropriate to continue at this time. The patient will continue this therapy as she is achieving therapeutic benefit.     Immunization Assessment   Immunization History   Administered Date(s) Administered    COVID-19 (MODERNA), mRNA vacc, 100 mcg/0.5 mL (PF) 02/13/2019, 03/13/2019       Vaccine history and recommended vaccinations were reviewed and will be recommended as appropriate. The patient will be reminded about the importance of receiving an annual influenza vaccine as indicated.      Safety Assessment  Risk Evaluation and Mitigation Strategy (REMS)  No REMS is required for this medication.    Reproductive Risk  Kristina Velazquez is a 32 y.o. female. As patient is a female not of child-bearing potential, education was not applicable.     Handling and Disposal  Appropriate safe handling and disposal procedures were reviewed with the patient. Kristina Velazquez was instructed to return any unused or expired oral chemotherapy medication to a designated disposal bin at one of the [facility] locations or to utilize a community drug take back program. Instructed not to flush down the toilet or to crush the medication    Follow-up Plan   Kristina Velazquez Liskey was encouraged to call the oral chemotherapy pharmacist at [phone] with questions. This medication is considered medium risk per our internal oral chemotherapy risk categorization. This re-assessment has been completed and the next reassessment planned for 12 months.    Oswaldo Milian, [facility]  Oncology Clinical Pharmacist PGY-2 Resident  06/12/2022

## 2022-06-14 ENCOUNTER — Encounter: Admit: 2022-06-14 | Discharge: 2022-06-14 | Payer: BC Managed Care – PPO

## 2022-06-14 NOTE — Telephone Encounter
This RN faxed signed lab orders to [facility] per their request. Fax confirmation received.

## 2022-06-18 ENCOUNTER — Encounter: Admit: 2022-06-18 | Discharge: 2022-06-18 | Payer: BC Managed Care – PPO

## 2022-06-24 ENCOUNTER — Encounter: Admit: 2022-06-24 | Discharge: 2022-06-24 | Payer: BC Managed Care – PPO

## 2022-06-24 DIAGNOSIS — C569 Malignant neoplasm of unspecified ovary: Secondary | ICD-10-CM

## 2022-06-24 MED ORDER — LYNPARZA 150 MG PO TAB
300 mg | ORAL_TABLET | Freq: Two times a day (BID) | ORAL | 3 refills
Start: 2022-06-24 — End: ?

## 2022-07-03 ENCOUNTER — Encounter: Admit: 2022-07-03 | Discharge: 2022-07-03 | Payer: BC Managed Care – PPO

## 2022-07-04 ENCOUNTER — Encounter: Admit: 2022-07-04 | Discharge: 2022-07-04 | Payer: BC Managed Care – PPO

## 2022-07-04 DIAGNOSIS — C569 Malignant neoplasm of unspecified ovary: Secondary | ICD-10-CM

## 2022-07-04 LAB — CBC AND DIFF

## 2022-07-04 LAB — COMPREHENSIVE METABOLIC PANEL

## 2022-07-04 MED ORDER — ONDANSETRON HCL 8 MG PO TAB
ORAL_TABLET | ORAL | 3 refills | 8.00000 days | Status: AC
Start: 2022-07-04 — End: ?

## 2022-07-04 MED ORDER — OLAPARIB 150 MG PO TAB
300 mg | ORAL_TABLET | Freq: Two times a day (BID) | ORAL | 3 refills | Status: AC
Start: 2022-07-04 — End: ?
  Filled 2022-07-05: qty 120, 30d supply, fill #1

## 2022-07-05 ENCOUNTER — Encounter: Admit: 2022-07-05 | Discharge: 2022-07-05 | Payer: BC Managed Care – PPO

## 2022-07-08 ENCOUNTER — Encounter: Admit: 2022-07-08 | Discharge: 2022-07-08 | Payer: BC Managed Care – PPO

## 2022-07-09 ENCOUNTER — Encounter: Admit: 2022-07-09 | Discharge: 2022-07-09 | Payer: BC Managed Care – PPO

## 2022-07-24 ENCOUNTER — Encounter: Admit: 2022-07-24 | Discharge: 2022-07-24 | Payer: BC Managed Care – PPO

## 2022-07-24 NOTE — Progress Notes
Contacted Kristina Velazquez to refill their medication(s) OLAPARIB 150 MG PO TAB.    On MyChart medication refill questionnaire patient reported missed doses: Pt stated they missed 2 doses.      The refill was processed. Ambulatory pharmacist notified.  [facility]ina [facility]az[facility]  [facility]  [phone]

## 2022-07-30 ENCOUNTER — Encounter: Admit: 2022-07-30 | Discharge: 2022-07-30 | Payer: BC Managed Care – PPO

## 2022-07-30 NOTE — Progress Notes
Genetic Variant Reclassification Letter  Variant of Unknown/Uncertain Significance (VUS)        Reclassified to Benign (or Likely Benign)    From:   [facility]  [facility], [place], [place]    The letter is to let you know that a genetic test result you previously received has been reanalyzed; interpretation of the MLH1 gene has been changed.  The gene was formerly classified as a Variant of Uncertain Significance (a VUS), but it has now been reclassified to a benign or a likely benign VUS.    THIS DOES NOT ALTER THE GENETIC FINDINGS IN THE BRCA1 OR PMS2 GENES THEY REMAIN PATHOGENIC.     This also does not alter the finding in the ATM gene which remains a Variant of Uncertain significance at this time.     No clinical action is indicated based on this changed interpretation of the gene test result.    Laboratories that perform genetic testing periodically review / reinterpret genetic test results. A Variant of Uncertain Significance (VUS) is a change in a gene that is not clearly positive or clearly negative based on information known at the time of the testing.    As genetic information grows, reclassifications are made based on new publications in the medical literature, new technology, and new ways to analyze the genes and their products / functions.  As more individuals are tested, more knowledge is added to the international databases. There are 5 different possible test results for each gene:        New genetic test panels may have become available since the testing was done on you. Additional genetic testing on you, or your family members, may be considered, depending on your personal and/or family history of cancer, and any new family diagnoses (see information about new panel genetic tests below.     For example, if any of the following apply to you, you may wish to consider additional genetic testing through our [facility].    If you had testing for a limited number of genes, and have a strong family history of cancer  If your testing was before June 2013 and only included sequencing of one or two genes (such as BRCA1 and BRCA2, but not deletion / duplication studies - at that time known as BART)  If you have been diagnosed with another cancer after drawing sample for the genetic test  If any close family member has been diagnosed with ovarian or peritoneal cancer  If a close family member has more than 10 - 20 colon polyps  If other family members have been diagnosed with cancer after your testing, especially if     the relative is under age 8 at diagnosis  the relative has a cancer associated with your cancer or another hereditary cancer syndrome  the relative has a cancer for which new testing, or a new insurance guideline, is available (for example, kidney, adrenal, prostate, uterine, or ovarian cancer, melanoma, many other cancers)      Questions?  Please let us know if you have any questions about this amended report. You can contact us at [phone] to make an appointment for a return visit to thoroughly review your current family history, past testing, and any additional testing options. A copy of this amended test report will be sent to your electronic medical record.    What else can you do with this information?    Consider entering data into one or  all of the following registries to advance knowledge of genetic disorders by sharing de-identifying genetic and health information.    These online registries are a joint effort between laboratories and academic medical centers to learn more about genes, with the help of patients who have had genetic panel testing and are found to have variants of uncertain significance. While all of the genes on the panels are associated with and increased risk of cancer, the understanding of risks associated with some of the genes are understood more than the others. Participation involves completing online surveys and patients can enroll directly through these websites:    PROMPT registry www.promptstudy.org  GenomeConnect- www.genomeconnect.org   ClinVar  (SimpleSpeech.is) or other variant classification database    [facility] Genetic Counselors:    Chevella Pearce, MS, CGC   Noelle Penner, MS, CGC  [employer], MS, CGC  Jordyn [place], [place] CGC  [place], MS CGC  [place], [place] CGC  Clarita Leber, MS [facility]      Certified Genetic Counselors    Appointments: [phone]      --------------------------------------------------------------------------------------------------------------------------------    Family Members can find a local board certified genetic counselor through these professional sites:  American Board of Genetic Counseling (CartridgeClearance.com.cy)  ?Find a Counselor? link  Therapist, music (IronJet.at)  ?Find a Dentist? link

## 2022-08-02 ENCOUNTER — Encounter: Admit: 2022-08-02 | Discharge: 2022-08-02 | Payer: BC Managed Care – PPO

## 2022-08-02 DIAGNOSIS — C569 Malignant neoplasm of unspecified ovary: Secondary | ICD-10-CM

## 2022-08-09 ENCOUNTER — Encounter: Admit: 2022-08-09 | Discharge: 2022-08-09 | Payer: BC Managed Care – PPO

## 2022-08-09 DIAGNOSIS — C569 Malignant neoplasm of unspecified ovary: Secondary | ICD-10-CM

## 2022-08-09 DIAGNOSIS — I4711 Inappropriate sinus tachycardia (HCC): Secondary | ICD-10-CM

## 2022-08-09 DIAGNOSIS — U071 COVID: Secondary | ICD-10-CM

## 2022-08-09 LAB — CBC AND DIFF
ABSOLUTE BASO COUNT: 0 K/UL (ref 0–0.20)
ABSOLUTE EOS COUNT: 0.1 K/UL (ref 0–0.45)
ABSOLUTE LYMPH COUNT: 1.2 K/UL (ref 1.0–4.8)
ABSOLUTE MONO COUNT: 0.5 K/UL (ref 0–0.80)
ABSOLUTE NEUTROPHIL: 5.1 K/UL (ref 1.8–7.0)
BASOPHILS %: 1 % (ref 0–2)
EOSINOPHILS %: 2 % (ref >60)
HEMATOCRIT: 42 % — ABNORMAL HIGH (ref 36–45)
LYMPHOCYTES %: 18 % — ABNORMAL LOW (ref 24–44)
MCH: 33 pg (ref 26–34)
MCHC: 34 g/dL (ref 32.0–36.0)
MCV: 96 FL (ref 80–100)
MONOCYTES %: 8 % (ref 4–12)
MPV: 7.7 FL (ref 7–11)
NEUTROPHILS %: 71 % (ref 41–77)
PLATELET COUNT: 328 K/UL (ref 150–400)
RBC COUNT: 4.4 M/UL — ABNORMAL HIGH (ref 4.0–5.0)
RDW: 14 % (ref 11–15)
WBC COUNT: 7.3 K/UL (ref 4.5–11.0)

## 2022-08-09 LAB — CA125: CA-125: 8 U/mL (ref <35)

## 2022-08-09 LAB — COMPREHENSIVE METABOLIC PANEL
POTASSIUM: 4.5 MMOL/L — ABNORMAL HIGH (ref 3.5–5.1)
SODIUM: 139 MMOL/L — ABNORMAL HIGH (ref 137–147)

## 2022-08-09 MED ORDER — PREMARIN 0.625 MG/GRAM VA CREA
.5 g | Freq: Every day | VAGINAL | 3 refills | 31.00000 days | Status: AC
Start: 2022-08-09 — End: ?

## 2022-08-12 ENCOUNTER — Encounter: Admit: 2022-08-12 | Discharge: 2022-08-12 | Payer: BC Managed Care – PPO

## 2022-08-12 MED FILL — OLAPARIB 150 MG PO TAB: 150 mg | ORAL | 30 days supply | Qty: 120 | Fill #2 | Status: AC

## 2022-08-14 ENCOUNTER — Encounter: Admit: 2022-08-14 | Discharge: 2022-08-14 | Payer: BC Managed Care – PPO

## 2022-09-04 ENCOUNTER — Encounter: Admit: 2022-09-04 | Discharge: 2022-09-04 | Payer: BC Managed Care – PPO

## 2022-09-05 ENCOUNTER — Encounter: Admit: 2022-09-05 | Discharge: 2022-09-05 | Payer: BC Managed Care – PPO

## 2022-09-06 ENCOUNTER — Encounter: Admit: 2022-09-06 | Discharge: 2022-09-06 | Payer: BC Managed Care – PPO

## 2022-09-06 DIAGNOSIS — E8940 Asymptomatic postprocedural ovarian failure: Secondary | ICD-10-CM

## 2022-09-06 DIAGNOSIS — Z79890 Hormone replacement therapy (postmenopausal): Secondary | ICD-10-CM

## 2022-09-06 DIAGNOSIS — C569 Malignant neoplasm of unspecified ovary: Secondary | ICD-10-CM

## 2022-09-06 DIAGNOSIS — R232 Flushing: Secondary | ICD-10-CM

## 2022-09-06 MED ORDER — DOTTI 0.0375 MG/24 HR TD PTSW
2 refills
Start: 2022-09-06 — End: ?

## 2022-09-06 NOTE — Progress Notes
Contacted Kristina Velazquez to refill their medication(s) LYNPARZA 150 MG PO TAB.    We were unable to reach the patient after three attempts. At this time, we will no longer attempt to contact the patient for the refill. The patient may be re-enrolled in the pharmacy refill management program at any time by contacting the pharmacy or refilling their medication. Ambulatory pharmacist notified.    Estill Bakes  [facility]  [phone]

## 2022-10-01 ENCOUNTER — Encounter: Admit: 2022-10-01 | Discharge: 2022-10-01 | Payer: BC Managed Care – PPO

## 2022-10-02 MED FILL — OLAPARIB 150 MG PO TAB: 150 mg | ORAL | 30 days supply | Qty: 120 | Fill #3 | Status: AC

## 2022-10-08 ENCOUNTER — Encounter: Admit: 2022-10-08 | Discharge: 2022-10-08 | Payer: BC Managed Care – PPO

## 2022-10-17 ENCOUNTER — Encounter: Admit: 2022-10-17 | Discharge: 2022-10-17 | Payer: BC Managed Care – PPO

## 2022-10-17 DIAGNOSIS — Z1501 Genetic susceptibility to malignant neoplasm of breast: Secondary | ICD-10-CM

## 2022-10-17 DIAGNOSIS — I4711 Inappropriate sinus tachycardia (HCC): Secondary | ICD-10-CM

## 2022-10-17 DIAGNOSIS — U071 COVID: Secondary | ICD-10-CM

## 2022-10-17 DIAGNOSIS — C569 Malignant neoplasm of unspecified ovary: Secondary | ICD-10-CM

## 2022-10-17 NOTE — Patient Instructions
The pre assessment clinic will phone you to provide you  your arrival time for surgery.          If you have questions or forget your arrival time, Call:  [phone]           **(Your surgery will not be listed under Visits  in your MyChart)**   Frequently Asked Questions     Pain Medication:    If plastic surgeon is involved in your care, please reach out to their office for pain medication and management instructions.    Take your tylenol as scheduled (do not skip doses).   Tylenol can be increased to 1000mg  every 6 hours as needed, not to exceed 4000mg  in a 24 hour period and only take this maximum for 2 days in a row before switching back to the lower dose.  1 day after surgery you can take ibuprofen 400mg  every 6 hours as needed in addition to tylenol for pain. Take these alternating (not both at the same time).   If you are still having 3/10 pain or more after tylenol and ibuprofen, start with 1 tablet of your prescribed pain medication. If after an hour there is little to no relief then take another tablet. If pain is not manageable by what has been prescribed please reach out to our office.  Most patients do not take all of the pain pills they are prescribed, so if the tylenol is working to control your pain, you do not need to take the prescription pain medication.   Pain medications are not refilled.       Armpit pain:  The sensory innervation runs down both sides of your armpit down to your finger tips. If your surgery involved a surgical incision that was made in your armpit you may experience armpit pain, arm pain, shoulder pain, numbness/tingling to your arm down to your finger tips. If you do experience this, this is normal nerve pain caused by irritation and inflammation. This nerve pain and or numbness/tingling will get better as you continue to heal. Breast stretching exercises will help with this pain, but it is normal for it to take 4-6 weeks for the inflammatory process to settle.      Of note- do not use deodorant or antiperspirant to your armpit area where a surgical incision site has been made for 4 weeks after your surgery.     Incision site Care / Showering:     If a plastic surgeon is involved in your care, please reach out to their office for incision care instructions.      Lumpectomy:   Your surgical incision site is closed with dissolvable sutures that will take approximately 6 months to dissolve. Your skin will be closed with medical grade skin glue, this will peel off on its own over the span of 4-6 weeks. Do not pick or peel off your glue. This glue is waterproof. You will also have a postop bra- the purpose of the postop bra is to help and prevent postop swelling. Postop swelling is expected, and with swelling compression is your best friend. Please remove the bra to shower, and you can take it off to wash it. If you experience any chaffing from the postop bra you may pad the area of skin with a small towel or fabric to prevent rubbing.     Mastectomy without reconstruction:   You will have internal dissolvable sutures as listed above, followed by a waterproof surgical dressing. You will also  have an ace wrap bandage on, followed by gauze dressings (fluffy gauze) and then your postop bra.      Please wear all to sleep. Please remove everything down to the clear waterproof dressing to shower.   DO NOT SHOWER WITH ACE WRAP, GAUZE OR POSTOP BRA ON. When you're done showering, please place back on. Ace wrap, postop bra and stuff gauze back in place.     You may wash your postop bra and ace wrap bandage via hand wash or washer if soiled or starts to smell.  If your gauze becomes soiled or begins to smell you may use a folded small towel/trshirt/sock to replace fluffy dressing. Purpose of the extra gauze is to provide additional compression for postop swelling.      Your ace wrap should fit comfortably to provide tight compression. This should not be so tight that you're experiencing difficulty breathing, if so, please take off and re-wrap. It should be tight enough where you are able to fit two fingers between your skin and the wrap. The wrap may start riding into your armpits, if this occurs, take off the wrap, start the wrap lower and work up so that it prevents from riding up.      If you experience any chaffing from the postop bra or wrap you may pad the area of skin with a small towel or fabric to prevent rubbing.     Do not place any heat or ice to your surgical incision sites.   BREAST SURGERY POST-OPERATIVE INSTRUCTIONS    DIET  -You have no dietary restrictions. Please continue with a healthy balanced diet.  -Do not drink alcohol while taking narcotic pain medication.    ACTIVITY  -For the first 24 hours after your surgery: Do not drive or use heavy equipment, do not make important decisions or sign legal papers and do not drink alcohol.  -No driving while taking narcotic pain medication.  -No lifting greater than 10 pounds.   -No repetitive motions with the upper body (ie. Laundry, vacuuming, washing windows/floors).  -Begin the exercises found in your treatment guide 24 hours after surgery.  -You may resume your normal activity once cleared by your surgeon. This can be discussed at your post-operative appointment.    INCISION CARE  *Keep your incision clean and dry.   *DO NOT apply ice or heat to any incision or surgical site.   *You may shower 24 hours after your procedure. It is safe for the dressings/incisions to get wet. Do not soak surgical site in a bathtub. Pat dressings/incisions dry after showering.   *You may resume your normal soap for showering. Showering within 48 hours of surgery will help clean the entire chest and reduce possible skin irritation/rashes from the solution used to sterilize your skin during surgery.  *Avoid applying powders, creams, lotions, etc. to your incision for 4 weeks.   *Deodorant may be used. Avoid direct application on incisions or dressings until post operative appointment.  *Usually there are no stitches to be removed. You will either have a Dermabond (skin glue) or Therabond (gray patch) dressing over your incision.   --Dermabond is an antimicrobial barrier that will begin to flake after approximately 1 week. Do not peel to remove prior to flaking. Do NOT apply ointments over the Dermabond as these substances can weaken the barrier and allow for incision breakdown.   --Therabond is a silver impregnated dressing that promotes wound healing. This dressing is to stay in place until removed  by your surgeon at your post-operative appointment.   *Your incision should gradually look better each day. If you notice unusual swelling, redness, drainage, increased pain at the surgery site(s) or have a fever greater than 100.4 degrees F, please notify your surgeon immediately.   *You may have a post-operative bra and fluffy dressings in place. Bra and fluffs may be removed 24-hours after surgery to shower. After showering, continue to wear the postoperative bra or a snug sports bra day and night until your post-operative appointment. You may remove your bra for showering and laundering. Do NOT wear underwire bras.  *If you have an ACE wrap following your procedure you may remove the wrap the day after surgery. If you were also given a post operative bra, continue to wear the bra until your post operative appointment.    SIGNS & SYMPTOMS  -Please contact your doctor if you have any of the following symptoms: temperature higher than 100 degrees F, uncontrolled pain, persistent nausea and/or vomiting, difficulty breathing or breast redness, leakage or incision breakdown/opening.     IF YOU HAVE A DRAIN  General Home Care Instructions:  *WASH HANDS PRIOR TO ANY HANDLING OF THE DRAIN(S).   *Empty drains or open and reset to suction at least 3 times per day.   *In the beginning (first 1-2 weeks), empty drains every 4 hours at a minimum while awake.   *Goal amount is <20cc per drain each time you empty. If measuring >20cc per drain when emptying, then drains need to be emptied more often.   *It is important to record the fluid output of the drains. Record quantities in mL amounts as shown on the collection cup. If you have more than one drain, track each drain's output individually.   *Reference Surgical Drain Care packet on how to empty and strip the drain.   *Reference Surgical Drain Care packet for drain emergencies and troubleshooting drain issues.   *Drains may be ready for removal when output is <30cc/24 hours for 2 days in a row.   *When drain removal criteria are met, removal will be done on weekdays during business hours.  Drain Dressing:  *There is a CHG Tegaderm dressing (patch with yellow gel and clear tape) over the drain site.   *DO NOT REMOVE THIS DRESSING unless instructed by your care team. This will be changed   during an appointment if needed.   *If the dressing falls off, please call to have a new dressing placed.   *You may shower with this dressing on. DO NOT soak in a tub or standing water until you are   cleared by your surgeon.  Normal Fluid Color:  *At first, fluid may be bloody.   *As you heal, the fluid will change to a watery red/orange/pink color, then change to light yellow or clear.   *There may be blood clots or white stringy tissue in the drains, which is normal. Make sure to strip the drain tubing a few times per day (see instructions on stripping drain tubing below).   *Foul-smelling drainage or white pus is NOT NORMAL. Call immediately.    MEDICATIONS  *Do not exceed 4,000mg  of Tylenol (acetaminophen) in a 24-hour period.   *Ibuprofen 400 mg every 6 hours as needed may be used for additional pain management and to reduce narcotic use. May begin the day after surgery as needed.  *Do not resume Ativan (lorazepam), Xanax (alprazolam), or Flexeril (cyclobenzaprine) while taking Valium (diazepam) for post surgical pain.  *If  you had reconstruction with a plastic surgeon, all pain medication refills will be completed by their office.    OPIOID (NARCOTIC) PAIN MEDICATION SAFETY  -We care about your comfort and believe you may need opioid medications at this time to treat your pain.  An opioid is a strong pain medication.  It is only available by prescription for moderate to severe pain.  Usually, these medications are used for only a short time to treat pain.    -When used the right way, opioids are safe and effective medications to treat your pain.  Yet, when used in the wrong way, opioids can be dangerous for you or others.  Opioids do not work for everyone.  Most patients do not get full relief of their pain from opioid medication; full relief of your pain may not be possible.    -For your safety, we ask you to follow these instructions:  *Only take your opioid medication as prescribed.  If your pain is not controlled with the prescribed dose or the medication is not lasting long enough, call your doctor.  *Do not break or crush your opioid medication unless your doctor or pharmacist says you can.  With certain medications this can be dangerous and may cause death.  *Never share your medication with others even if they appear to have a good reason.  Never take someone else?s pain medication - this is dangerous and illegal.  Overdoses and deaths have occurred.  *Keep your opioid medications safe, as you would with cash, in a lock box or similar container.  *Make sure your opioids are going to be secure, especially if you are around children or teens.   *Talk with your doctor or pharmacist if you have questions about taking other medications.   *Avoid driving, operative machinery or drinking alcohol while taking opioid pain medication.  This may be unsafe.     CONSTIPATION PREVENTION  -Some patients may require narcotic pain medication following surgery. These medications frequently cause constipation.   -Constipation can also occur following surgery due to anesthesia, inactivity and decreased oral intake.   -Constipation can cause discomfort, bloating, nausea and vomiting.   -Below are recommendations on how to treat and prevent constipation if you experience this following surgery:   *Start a daily laxative regimen as soon as you start any narcotic pain medication or see any deviation from your normal bowel pattern. If you stay overnight after surgery, a laxative will be started while you are in the hospital.   *Medications that can help with constipation:   -Senokot-S (1 tab twice daily)   -Miralax (1-2 capfuls daily)   -Fiber supplements (Fibercon or Benefiber). Follow manufacturer dosing recommendations. You can also consider dietary fiber options such as prunes or prune juice.   -Probiotics (1-2 daily; Bio-K+, Culturelle, Comptroller). Do not take a probiotic if you are on chemotherapy unless you have received permission from your oncologist.   -Milk of Magnesia (1,200mg /5ML, 2-4 doses daily). Do not take if you have kidney issues or are on dialysis.    *If you are still having trouble with constipation despite using the above medications for 1-2 days without a bowel movement or have significant discomfort from constipation, you may try the below over the counter option. Do not go longer than 2 days without a bowel movement before you try taking additional laxatives.   -Magnesium Citrate (1-2 bottles in a 24-hour period). Do not take if you have kidney issues or are on dialysis.  For questions or concerns regarding your hospital stay:  - DURING BUSINESS HOURS (8:00 AM - 4:30 PM):    Call [phone] to speak to your surgeon?s nurse    - AFTER BUSINESS HOURS (4:30 PM - 8:00 AM, on weekends, or holidays):  Call [phone] and ask to page the BREAST SURGERY team      IF A PLASTIC SURGEON WAS INVOLVED IN YOUR SURGERY:    - DURING BUSINESS HOURS (8:00 AM - 4:30 PM):    Call the office at [phone]    - AFTER BUSINESS HOURS (4:30 PM - 8:00 AM, on weekends, or holidays):  Call [phone] and ask to page the PLASTIC SURGERY team    *If you had reconstruction with a plastic surgeon, all pain medication refills will be completed by their office.     EXAMPLES OF POSTOPERATIVE BRAS    -You will go home from surgery with a postop bra in place.     -This is only a guideline with examples of other appropriate postoperative bras.  You do not need to purchase these unless you would like additional bras to wear.  [place] breast [facility] does not profit from any of these bras  - Wear non-under wire bras for 4 weeks.   - Sports bras that snap or zip in front are usually the easiest after breast surgery.       Ecologist Surgery Bra Surgical Bra Compression Sports Bra Front Closure [place] for Women Close Breast Augmentation Bra Wireless  Price $19.99 + tax        [employer]- BRABIC Zip Surveyor, quantity for Women Post Surgery Compression Support with Adjustable Straps Wirefree  Price Varies: $20.79 - $25.99          [employer]- BRABIC Administrator, sports for Women Arm Compression Post Surgery Front Closure Bra Tank Top [employer]  Price: $24.99 + tax         -    [employer] - CYDREAM Wireless Bra for [employer]ale Post Surgery Compression Sports Bra Shapewear Camisole Crop Tops  Price: $24.99 + tax         [employer]- CAREFIX -CAREFIX Sophia Front Close Post-Op Compression Surgical Vest [phone] by Orlene Och Varies: $74.76 - $99.68          [employer]- Women's UA Continuum High Zip Sports Bra   Higher quality.  Will last forever if you hang up to dry.  Has best support for exercise.  Online or in stores.  Can find at [place], sports stores, some [place], or online.    Price varies: $65.00 and up + tax        [email or web address] - Conservator, museum/gallery  Price: $80.00 + tax      [employer] - FlexFit High Coverage Zip-Front Bra - Style NO. BNRZ  Price: $128.00 + tax        Brewing technologist for [employer] - Zip Front Sports Bra Plus Size Wireless for Running Workout  For our larger chested patients- can go up to [employer]  Price: $36.99 + Tax

## 2022-10-17 NOTE — Progress Notes
Name: Kristina Velazquez          MRN: [id number]      DOB: 1990-09-20      AGE: 32 y.o.  Patient identification was verified with two patient identifiers prior to today's discussion.    DATE OF SERVICE: 10/17/2022    Subjective:             Reason for Visit:  New Patient      Kristina Velazquez is a 32 y.o. female presenting for new patient consultation.     Cancer Staging   Ovarian cancer [facility])  Staging form: Ovary, Fallopian Tube, And Primary Peritoneal Carcinoma, AJCC 8th Edition  - Clinical: FIGO Stage IIIC (cT3c, cM0) - Signed by Eileen Stanford, MD on 08/22/2020    DIAGNOSIS:    BRCA1 mutation  History of ovarian cancer, dx 07/2020    History of Present Illness    Kristina Velazquez is a female who presented to the [facility] on 10/17/2022 at age 32 for discussion regarding risk reducing mastectomies due to her known BRCA1 mutation. She completed genetic testing due to her family history of breast and ovarian cancer, and her mother's positive genetic testing. 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 She is currently on olaparib since 02/2021. She has been following in the high risk breast clinic, and has had normal breast imaging. She has no prior history of breast biopsies or surgeries.     She is doing well today and has no focal breast or systemic concerns. She is due for her screening mammogram, and is in the process of getting that scheduled. 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 She does not smoke, and does not drink.  She has no history of CVA, MI, or DVT, and is not anticoagulated. She is not diabetic, and denies any lung issues such as asthma, COPD, or OSA. She has no prior history of issues with anesthesia. She reports she is not able to stop her olaparib until February/March 2025, and cannot have surgery until she is off of it due to wound healing concerns. She is interested in proceeding with risk reducing mastectomy either late 2025 or in 2026.    BREAST IMAGING:  Mammogram:    -- Bilateral screening mammogram 10/08/21 ([place]) revealed scattered areas of fibroglandular density. No suspicious abnormality is seen.   MRI:    -- Bilateral screening breast MRI 01/11/22 ([place]) revealed scattered fibroglandular tissue. The background parenchymal enhancement is mild and symmetric. Right breast: No suspicious mass or non-mass enhancement is seen within the right breast. No suspicious right axillary or internal mammary lymph nodes are identified. Left Breast: No suspicious mass or non-mass enhancement is seen within the left breast. No suspicious left axillary or internal mammary lymph nodes are identified. INCIDENTAL FINDINGS: Left chest port. IMPRESSION: No evidence of breast malignancy     REPRODUCTIVE HEALTH:  Age at first Menarche:  38  Age at First Live Birth:  n/a  Age at Menopause:  Surgical at age 5. 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 She passed away at 3 years of age.   Father was diagnosed with kidney cancer at 32 years of age.   Paternal grandmother was diagnosed with breast cancer at 32 years of age.    REFERRED BY: Ezzard Flax, APRN            The following medical/surgical/family/social history and the list of medications are current, as of 10/17/2022    Past Medical History:   Diagnosis Date    COVID 01/29/2020    Inappropriate sinus tachycardia (HCC)     Ovarian cancer [facility])      Surgical History:   Procedure Laterality Date    HX SHOULDER SURGERY Right 2008    HX WISDOM TEETH EXTRACTION  2014    placement of port-a-cath with fluoroscopy Left 08/25/2020    Performed by Freund, Alecia Lemming., MD at [facility] OR    FLUOROSCOPIC GUIDANCE CENTRAL VENOUS ACCESS DEVICE PLACEMENT/ REPLACEMENT/ REMOVAL Left 08/25/2020    Performed by Freund, Alecia Lemming., MD at [facility] OR    ESOPHAGOGASTRODUODENOSCOPY WITH SPECIMEN COLLECTION BY BRUSHING/ WASHING N/A 11/13/2020    Performed by Comer Locket, MD at [facility] ENDO    COLONOSCOPY DIAGNOSTIC WITH SPECIMEN COLLECTION BY BRUSHING/ WASHING - FLEXIBLE N/A 11/13/2020    Performed by Comer Locket, MD at [facility] ENDO    TOTAL ABDOMINAL HYSTERECTOMY AND BILATERAL SALPINGO-OOPHORECTOMY WITH OMENTECTOMY AND RADICAL RESECTION FOR DEBULKING, APPENDECTOMY, Bilateral 11/14/2020    Performed by Eileen Stanford, MD at [facility] OR    Cytoreductive surgery; largest tumor approx. 6 cm Hyperthermic Intraperitoneal chemotherapy  N/A 11/14/2020    Performed by Dia Crawford, MD at [facility] OR    CHEMO CART      HX APPENDECTOMY      HX CHOLECYSTECTOMY      HX HYSTERECTOMY      OVARY SURGERY      TUNNELED VENOUS PORT PLACEMENT       Family History   Problem Relation Name Age of Onset    Genetic Disorder Mother Corie Chiquito Mother Dois Davenport 66    Cancer-Ovarian Mother Dois Davenport 19    Heart Disease Mother Dois Davenport     Depression Mother Dois Davenport     Kidney Cancer Father John 70    Hypertension Father John     None Reported Sister      None Reported Brother      Unknown to Patient Paternal Uncle      Hypertension Maternal Grandmother Nedra Hai     Arthritis-osteo Maternal Grandmother Nedra Hai     Migraines Maternal Grandmother Nedra Hai     Depression Maternal Grandmother Lee     Heart Disease Maternal Grandfather Jake Shark     Hypertension Maternal Grandfather Allena Napoleon Paternal Grandmother Idell Pickles 79    Hypertension Paternal Dietrich Pates     Stroke Paternal Environmental manager      Social History     Socioeconomic History    Marital status: Married   Tobacco Use    Smoking status: Never    Smokeless tobacco: Never   Vaping Use    Vaping status: Never Used   Substance and Sexual Activity    Alcohol use: Yes     Alcohol/week: 1.0 standard drink of alcohol     Types: 1 Glasses of wine per week     Comment: OCASSIONALLY     Drug use: Never  Sexual activity: Yes     Partners: Male     Birth control/protection: Post-menopausal         Allergies   Allergen Reactions    Carboplatin CHEST TIGHTNESS, FLUSHING (SKIN), HEADACHE, RASH, SHORTNESS OF BREATH, SEE COMMENTS and REDNESS     Did not complete the remainder of the infusion.  Additional reaction 01/24/21 during bag A, rash located neck, chest and back of ears, dyspnea, hypertension with BP 140/94, tachycardia with HR 138 and chest tightness.  Completed the remainder of the infusion.    Hydrocodone STOMACH UPSET and VOMITING    Paclitaxel CHEST TIGHTNESS, VISION CHANGES and REDNESS    Peanut RASH        Objective:          amphetamine-dextroamphetamine XR (ADDERALL XR) 30 mg capsule Take one capsule by mouth twice daily.    conjugated estrogens (PREMARIN) 0.625 mg/g vaginal cream Insert or Apply one-half g to vaginal area daily. Apply to area nightly for the first 2 weeks then switch to every other night.    diphenhydrAMINE hcl (BENADRYL ALLERGY) 25 mg tablet Take one tablet to two tablets by mouth at bedtime as needed.    estradioL (DOTTI) 0.0375 mg/24 hr patch APPLY ONE PATCH TO TOPICALLY OF SKIN AS DIRECTED TWICE WEEKLY.    lidocaine/prilocaine (EMLA) 2.5/2.5 % topical cream Apply  topically to affected area as Needed. Apply 30-60 minutes prior to port access.    olaparib (LYNPARZA) 150 mg tablet Take two tablets by mouth twice daily.    ondansetron HCL (ZOFRAN) 8 mg tablet Take 1 tablet by mouth twice a day 30 minutes prior to each Olaparib dose.  Indications: prevent nausea and vomiting from cancer chemotherapy    pantoprazole DR (PROTONIX) 40 mg tablet Take one tablet by mouth twice daily.     Vitals:    10/17/22 1313   BP: 121/85   BP Source: Arm, Left Upper   Pulse: 88   Temp: 36.7 ?C (98 ?F)   Resp: 14 SpO2: 100%   TempSrc: Temporal   PainSc: Zero   Weight: 105.7 kg (233 lb)   Height: 170.8 cm (5' 7.25)     Body mass index is 36.22 kg/m?Marland Kitchen     Pain Score: Zero            Pain Addressed:  N/A    Patient Evaluated for a Clinical Trial: No treatment clinical trial available for this patient.     [place] Cooperative Oncology Group performance status is 0, Fully active, able to carry on all pre-disease performance without restriction.Marland Kitchen     Physical Exam  Vitals reviewed.            RIGHT BREAST EXAM:  Breast:  G3 ptosis, no dominant mass  Skin Erythema:  No  Attachment of Overlying Skin:  No  Peau d' orange:  No  Chest Wall Attachment:  No  Nipple Inversion:  No  Nipple Discharge: No    LEFT BREAST EXAM:  Breast: G3 ptosis, no dominant mass  Skin Erythema:  No  Attachment of Overlying Skin:  No  Peau d' orange:  No  Chest Wall Attachment: No  Nipple Inversion:  No  Nipple Discharge:  No    RIGHT NODAL BASIN EXAM:  Axillary:  negative  Infraclavicular:  negative  Supraclavicular:  negative    LEFT NODAL BASIN EXAM:  Axillary:  negative  Infraclavicular: negative  Supraclavicular:  negative    Constitutional: Obese (Body mass index is 36.22 kg/m?Marland Kitchen).  Well-developed and well-nourished. No acute distress.  HEENT:  Head: Normocephalic and atraumatic.  Eyes: No discharge. No scleral icterus.  Cardiovascular: Normal rate.  Pulmonary/Chest: Effort normal. No respiratory distress.    Musculoskeletal: No edema.  Neurological: Alert and oriented to person, place and time. No cranial nerve deficit.  Skin: Warm and dry. No rash noted. No erythema. No pallor.  Psychiatric: Normal mood and affect. Behavior is normal. Judgement and thought content normal.       Assessment and Plan:    Kristina Velazquez is a 32 y.o. female with the BRCA1 mutation and history of ovarian cancer, dx 07/2020    We discussed the continuum of risk modification strategies, including the minimum she should be doing (2x per year CBE along with yearly mammogram, specifically 3D mammogram). Increased screening in the form of yearly MRI versus yearly breast ultrasound (alternating with mammogram but not replacing mammogram) was also recommended for increased screening. She is established with Dr. Donneta Romberg in the high risk breast clinic for management of increased screening. She should continue to follow with this team for management including imaging recommendations until such time as she proceed with surgery.     We discussed in detail that the most aggressive means of breast cancer risk reduction is surgery, but this is also the most effective. After bilateral risk reducing mastectomy, her risk of BC is not zero, but is very low around 3-5%. We discussed the options for reconstruction vs no reconstruction, including the need for additional surgery if she elected to proceed with reconstruction. We also discussed risks of surgery including bleeding, infection, risk of anesthesia. Numbness of the skin is common and is permanent after mastectomy. Prophylactic mastectomy has never been proven to prolong one's life expectancy, so it is done for risk reduction measures only. We discussed she does need updated imaging within 6 months prior to proceeding with prophylactic surgery. We would plan for TM versus SSM approach pending her decision on reconstruction. She is not a candidate for NSM due to breast size/ptosis.    In addition, we reviewed some details of reconstruction. With implant based reconstruction, there are sometimes options for direct to implant versus TE with implant at later date. The final decision on direct to implant versus TE would be made at the time of PRS consultation or sometimes based on intraoperative evaluation by the plastic surgeon. Downsides of this type of reconstruction include need for monitoring and replacement of the implants usually along the timeline of 10 years. PRS would provide exact details on this follow-up, but they are not permanent implants. Scheduling for this operation takes 6-8 weeks' advanced notice. With autologous tissue reconstruction (usually DIEP), this is a longer hospitalization and recovery timeframe along with some elevated peri-operative risks but with the benefit of your own tissue such that it is permanent. Scheduling for this operation takes 8-12 months' or more advanced notice given the availability of the microvascular surgeons and specialty equipment in the OR. We discussed specifically I am not sure if she is a candidate for autologous tissue or what the donor site would be given her prior surgical history and midline incision so this would be something the PRS would need to advise on eligibility for overall. Additional operations may be required for both types of reconstruction to rebuild the NAC or for contouring and improving cosmesis. With respect to TM, we discussed the lateral tissue which is outside the boundaries of the mastectomy would remain in place (posterior axillary line extending  toward back) which can result in a natural concave (sunken in) appearance to the chest wall as the skin heals overlying the pectoralis muscle but the soft tissue overlying the rest of the torso outside the boundaries of the breast remains unchanged.    After our conversation, she verbalized interest in proceeding with RRM in late 2025 or 2026 after she completes her olaparib therapy for her ovarian cancer (anticipated completion Feb/March 2025). She should continue to follow in the high risk clinic for high risk screening until she is ready to proceed with surgery. We asked that she notify our office at least 4-6 months ahead of her preferred window of time for surgery if she is considering DTI/TE or TM in order to RTC for updated exam, coordinate DOS with PRS, and arrange PRS consultation. If she would like PRS referral to consider autologous tissue we will place this referral and their office She verbalized understanding and agreed. Kristina Velazquez was given ample time to ask questions all of which were answered to her satisfaction.     Continue to follow with Dr. [name] with gyn/onc  Continue to follow with Dr. Donneta Romberg in the high risk breast clinic  RTC when ready to proceed with surgery with timeline as noted above    Scarlett Presto, PA-C        ATTESTATION     I personally confirmed the patient with two identifiers (full name and DOB) and personally reviewed images, performed a history and breast exam, and completed a discussion, assessment, and treatment plan. I have reviewed the initial history and plan outlined by the APP.  I have personally documented additional history, physical exam, and plan in the note above.       Ignatius Specking, MD FACS      Based on the Cares Act all results will be immediately released to MyChart.  We will be completing a thorough review of the additional information and putting it in context with the overall diagnosis.  We will update the patient once this has been completed to discuss the results, as well as the next steps in treatment.

## 2022-10-18 ENCOUNTER — Encounter: Admit: 2022-10-18 | Discharge: 2022-10-18 | Payer: BC Managed Care – PPO

## 2022-10-23 ENCOUNTER — Encounter: Admit: 2022-10-23 | Discharge: 2022-10-23 | Payer: BC Managed Care – PPO

## 2022-10-24 ENCOUNTER — Encounter: Admit: 2022-10-24 | Discharge: 2022-10-24 | Payer: BC Managed Care – PPO

## 2022-10-25 ENCOUNTER — Encounter: Admit: 2022-10-25 | Discharge: 2022-10-25 | Payer: BC Managed Care – PPO

## 2022-10-25 NOTE — Progress Notes
Contacted [name] to refill their medication(s) LYNPARZA 150 MG PO TAB.    We were unable to reach the patient after three attempts. At this time, we will no longer attempt to contact the patient for the refill. The patient may be re-enrolled in the pharmacy refill management program at any time by contacting the pharmacy or refilling their medication. Ambulatory pharmacist notified.    Lavon Paganini  [facility]  [phone]

## 2022-10-28 ENCOUNTER — Encounter: Admit: 2022-10-28 | Discharge: 2022-10-28 | Payer: BC Managed Care – PPO

## 2022-10-29 ENCOUNTER — Encounter: Admit: 2022-10-29 | Discharge: 2022-10-29 | Payer: BC Managed Care – PPO

## 2022-10-29 MED FILL — OLAPARIB 150 MG PO TAB: 150 mg | ORAL | 30 days supply | Qty: 120 | Fill #4 | Status: AC

## 2022-11-14 ENCOUNTER — Encounter: Admit: 2022-11-14 | Discharge: 2022-11-14 | Payer: BC Managed Care – PPO

## 2022-11-14 DIAGNOSIS — C569 Malignant neoplasm of unspecified ovary: Secondary | ICD-10-CM

## 2022-11-15 ENCOUNTER — Encounter: Admit: 2022-11-15 | Discharge: 2022-11-15 | Payer: BC Managed Care – PPO

## 2022-11-15 DIAGNOSIS — Z1501 Genetic susceptibility to malignant neoplasm of breast: Secondary | ICD-10-CM

## 2022-11-15 DIAGNOSIS — C569 Malignant neoplasm of unspecified ovary: Secondary | ICD-10-CM

## 2022-11-15 DIAGNOSIS — U071 COVID: Secondary | ICD-10-CM

## 2022-11-15 DIAGNOSIS — I4711 Inappropriate sinus tachycardia (HCC): Secondary | ICD-10-CM

## 2022-11-15 DIAGNOSIS — Z5111 Encounter for antineoplastic chemotherapy: Secondary | ICD-10-CM

## 2022-11-15 DIAGNOSIS — K253 Acute gastric ulcer without hemorrhage or perforation: Secondary | ICD-10-CM

## 2022-11-15 DIAGNOSIS — K2900 Acute gastritis without bleeding: Secondary | ICD-10-CM

## 2022-11-15 LAB — CA125: CA-125: 9 U/mL (ref <35)

## 2022-11-15 MED ORDER — OLAPARIB 150 MG PO TAB
300 mg | ORAL_TABLET | Freq: Two times a day (BID) | ORAL | 3 refills | Status: AC
Start: 2022-11-15 — End: ?
  Filled 2022-12-03: qty 120, 30d supply, fill #1

## 2022-11-15 MED ORDER — CHOLESTYRAMINE (WITH SUGAR) 4 GRAM PO PWPK
1 | PACK | Freq: Every day | ORAL | 0 refills | 90.00000 days | Status: AC
Start: 2022-11-15 — End: ?

## 2022-11-18 ENCOUNTER — Encounter: Admit: 2022-11-18 | Discharge: 2022-11-18 | Payer: BC Managed Care – PPO

## 2022-11-19 ENCOUNTER — Encounter: Admit: 2022-11-19 | Discharge: 2022-11-19 | Payer: BC Managed Care – PPO

## 2022-11-20 ENCOUNTER — Encounter: Admit: 2022-11-20 | Discharge: 2022-11-20 | Payer: BC Managed Care – PPO

## 2022-11-21 ENCOUNTER — Encounter: Admit: 2022-11-21 | Discharge: 2022-11-21 | Payer: BC Managed Care – PPO

## 2022-11-22 ENCOUNTER — Encounter: Admit: 2022-11-22 | Discharge: 2022-11-22 | Payer: BC Managed Care – PPO

## 2022-11-22 NOTE — Progress Notes
Contacted [name] to refill their medication(s) LYNPARZA PO.    We were unable to reach the patient after three attempts. At this time, we will no longer attempt to contact the patient for the refill. The patient may be re-enrolled in the pharmacy refill management program at any time by contacting the pharmacy or refilling their medication. Ambulatory pharmacist notified.    Eliezer Lofts  [facility]  [phone]

## 2022-11-25 ENCOUNTER — Encounter: Admit: 2022-11-25 | Discharge: 2022-11-25 | Payer: BC Managed Care – PPO

## 2022-11-25 MED ORDER — CHOLESTYRAMINE LIGHT 4 GRAM PO POWD
1 | Freq: Two times a day (BID) | ORAL | 3 refills | 90.00000 days | Status: AC
Start: 2022-11-25 — End: ?

## 2022-12-01 ENCOUNTER — Encounter: Admit: 2022-12-01 | Discharge: 2022-12-01 | Payer: BC Managed Care – PPO

## 2022-12-03 ENCOUNTER — Encounter: Admit: 2022-12-03 | Discharge: 2022-12-03 | Payer: BC Managed Care – PPO

## 2022-12-05 ENCOUNTER — Encounter: Admit: 2022-12-05 | Discharge: 2022-12-05 | Payer: BC Managed Care – PPO

## 2022-12-16 ENCOUNTER — Encounter: Admit: 2022-12-16 | Discharge: 2022-12-16 | Payer: BC Managed Care – PPO

## 2022-12-16 NOTE — Progress Notes
Genetic Variant Reclassification Letter  Variant of Unknown/Uncertain Significance (VUS)        Reclassified to Benign (or Likely Benign)    From:   [facility]  [facility], [place], [place]    The letter is to let you know that a genetic test result you previously received has been reanalyzed; interpretation of the ATM gene has been changed.  The gene was formerly classified as a Variant of Uncertain Significance (a VUS), but it has now been reclassified to a benign or a likely benign VUS.    THIS DOES NOT ALTER THE GENETIC FINDINGS IN THE BRCA1 OR PMS2 GENES THEY REMAIN PATHOGENIC.     No clinical action is indicated based on this changed interpretation of the gene test result.    Laboratories that perform genetic testing periodically review / reinterpret genetic test results. A Variant of Uncertain Significance (VUS) is a change in a gene that is not clearly positive or clearly negative based on information known at the time of the testing.    As genetic information grows, reclassifications are made based on new publications in the medical literature, new technology, and new ways to analyze the genes and their products / functions.  As more individuals are tested, more knowledge is added to the international databases. There are 5 different possible test results for each gene:        New genetic test panels may have become available since the testing was done on you. Additional genetic testing on you, or your family members, may be considered, depending on your personal and/or family history of cancer, and any new family diagnoses (see information about new panel genetic tests below.     For example, if any of the following apply to you, you may wish to consider additional genetic testing through our [facility].    If you had testing for a limited number of genes, and have a strong family history of cancer  If your testing was before June 2013 and only included sequencing of one or two genes (such as BRCA1 and BRCA2, but not deletion / duplication studies - at that time known as BART)  If you have been diagnosed with another cancer after drawing sample for the genetic test  If any close family member has been diagnosed with ovarian or peritoneal cancer  If a close family member has more than 10 - 20 colon polyps  If other family members have been diagnosed with cancer after your testing, especially if     the relative is under age 51 at diagnosis  the relative has a cancer associated with your cancer or another hereditary cancer syndrome  the relative has a cancer for which new testing, or a new insurance guideline, is available (for example, kidney, adrenal, prostate, uterine, or ovarian cancer, melanoma, many other cancers)      Questions?  Please let us know if you have any questions about this amended report. You can contact us at [phone] to make an appointment for a return visit to thoroughly review your current family history, past testing, and any additional testing options. A copy of this amended test report will be sent to your electronic medical record.    What else can you do with this information?    Consider entering data into one or all of the following registries to advance knowledge of genetic disorders by sharing de-identifying genetic and health information.    These online registries are  a joint effort between laboratories and academic medical centers to learn more about genes, with the help of patients who have had genetic panel testing and are found to have variants of uncertain significance. While all of the genes on the panels are associated with and increased risk of cancer, the understanding of risks associated with some of the genes are understood more than the others. Participation involves completing online surveys and patients can enroll directly through these websites:    PROMPT registry www.promptstudy.org  GenomeConnect- www.genomeconnect.org   ClinVar  (SimpleSpeech.is) or other variant classification database    [facility] Genetic Counselors:    Miyanna Wiersma, MS, CGC   Noelle Penner, MS, CGC  [employer], MS, CGC  Jordyn [place], [place] CGC  [place], MS CGC  [place], MS CGC  [place], MS CGC  [place], MS  Barbette Hair, MS [facility]      Certified Genetic Counselors    Appointments: [phone]      --------------------------------------------------------------------------------------------------------------------------------    Family Members can find a local board certified genetic counselor through these professional sites:  American Board of Genetic Counseling (CartridgeClearance.com.cy)  ?Find a Counselor? link  Therapist, music (IronJet.at)  ?Find a Dentist? link

## 2022-12-17 ENCOUNTER — Encounter: Admit: 2022-12-17 | Discharge: 2022-12-17 | Payer: BC Managed Care – PPO

## 2022-12-18 ENCOUNTER — Encounter: Admit: 2022-12-18 | Discharge: 2022-12-18 | Payer: BC Managed Care – PPO

## 2022-12-23 ENCOUNTER — Encounter: Admit: 2022-12-23 | Discharge: 2022-12-23 | Payer: BC Managed Care – PPO

## 2022-12-23 DIAGNOSIS — Z79890 Hormone replacement therapy (postmenopausal): Secondary | ICD-10-CM

## 2022-12-23 DIAGNOSIS — C569 Malignant neoplasm of unspecified ovary: Secondary | ICD-10-CM

## 2022-12-23 DIAGNOSIS — E8940 Asymptomatic postprocedural ovarian failure: Secondary | ICD-10-CM

## 2022-12-23 DIAGNOSIS — R232 Flushing: Secondary | ICD-10-CM

## 2022-12-23 MED ORDER — DOTTI 0.0375 MG/24 HR TD PTSW
MEDICATED_PATCH | TRANSDERMAL | 2 refills | 30.00000 days | Status: AC
Start: 2022-12-23 — End: ?

## 2022-12-30 ENCOUNTER — Encounter: Admit: 2022-12-30 | Discharge: 2022-12-30 | Payer: BC Managed Care – PPO

## 2022-12-31 ENCOUNTER — Encounter: Admit: 2022-12-31 | Discharge: 2022-12-31 | Payer: BC Managed Care – PPO

## 2022-12-31 NOTE — Progress Notes
Contacted [name] to refill their medication(s) LYNPARZA 150 MG PO TAB.    We were unable to reach the patient after three attempts. At this time, we will no longer attempt to contact the patient for the refill. The patient may be re-enrolled in the pharmacy refill management program at any time by contacting the pharmacy or refilling their medication. Ambulatory pharmacist notified.    [facility] Anikah Hogge  Outpatient Retail Pharmacy  [phone]

## 2023-02-14 ENCOUNTER — Encounter: Admit: 2023-02-14 | Discharge: 2023-02-14 | Payer: BC Managed Care – PPO

## 2023-02-14 DIAGNOSIS — C569 Malignant neoplasm of unspecified ovary: Secondary | ICD-10-CM

## 2023-02-21 ENCOUNTER — Encounter: Admit: 2023-02-21 | Discharge: 2023-02-21 | Payer: BC Managed Care – PPO

## 2023-02-21 DIAGNOSIS — C569 Malignant neoplasm of unspecified ovary: Secondary | ICD-10-CM

## 2023-02-21 LAB — CBC AND DIFF
~~LOC~~ BKR MCH: 32 pg (ref 26.0–34.0)
~~LOC~~ BKR MCHC: 34 g/dL (ref 32.0–36.0)
~~LOC~~ BKR MCV: 92 fL — ABNORMAL HIGH (ref 80.0–100.0)
~~LOC~~ BKR RBC COUNT: 4.6 10*6/uL (ref 4.00–5.00)
~~LOC~~ BKR RDW: 14 % (ref 11.0–15.0)
~~LOC~~ BKR WBC COUNT: 6.9 10*3/uL (ref 4.50–11.00)

## 2023-02-21 LAB — COMPREHENSIVE METABOLIC PANEL
~~LOC~~ BKR CHLORIDE: 107 mmol/L (ref 98–110)
~~LOC~~ BKR POTASSIUM: 4.1 mmol/L (ref 3.5–5.1)

## 2023-02-21 LAB — CA125: ~~LOC~~ BKR CA-125: 9 U/mL (ref <35)

## 2023-03-07 ENCOUNTER — Encounter: Admit: 2023-03-07 | Discharge: 2023-03-07 | Payer: BC Managed Care – PPO

## 2023-03-10 ENCOUNTER — Encounter: Admit: 2023-03-10 | Discharge: 2023-03-10 | Payer: BC Managed Care – PPO

## 2023-03-20 ENCOUNTER — Encounter: Admit: 2023-03-20 | Discharge: 2023-03-20 | Payer: BC Managed Care – PPO

## 2023-03-20 DIAGNOSIS — Z79890 Hormone replacement therapy (postmenopausal): Secondary | ICD-10-CM

## 2023-03-20 DIAGNOSIS — E8940 Asymptomatic postprocedural ovarian failure: Secondary | ICD-10-CM

## 2023-03-20 DIAGNOSIS — R232 Flushing: Secondary | ICD-10-CM

## 2023-03-20 DIAGNOSIS — C569 Malignant neoplasm of unspecified ovary: Secondary | ICD-10-CM

## 2023-03-20 MED ORDER — DOTTI 0.0375 MG/24 HR TD PTSW
MEDICATED_PATCH | TRANSDERMAL | 3 refills | 30.00000 days | Status: AC
Start: 2023-03-20 — End: ?

## 2023-03-31 ENCOUNTER — Encounter: Admit: 2023-03-31 | Discharge: 2023-03-31 | Payer: BC Managed Care – PPO

## 2023-05-20 ENCOUNTER — Encounter: Admit: 2023-05-20 | Discharge: 2023-05-20 | Payer: BLUE CROSS/BLUE SHIELD

## 2023-05-28 NOTE — Progress Notes
 Subjective     GYNECOLOGIC ONCOLOGY EVALUATION    Name:Kristina Velazquez    Date: 05/30/2023    Referring & Primary Care Physician: Abe Hodgkins    Chief Complaint:   Chief Complaint   Patient presents with    Heme/Onc Care     History of Present Illness:    Kristina Velazquez is a 33 y.o. 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 She started on Olaparib 300mg  BID on 03/03/21 and completed 03/2023. Patient presents to clinic today for her 3 month follow up visit.        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 They describe their heart as 'racing all the time' and feeling 'exhausted' with noticeable 'fluttering'.    Currently, they are taking Corlanor to manage the tachycardia. Despite undergoing heart monitoring, no definitive cause for the sinus node activity has been identified. They note an improvement in energy levels after discontinuing the PARP inhibitor.    No abdominal symptoms such as pain, bloating, or changes in bowel habits. They are involved in gardening activities with their family, including their children, and plan to set up bucket gardens.            Onc Timeline Overview Note   Reproductive History  Menarche: Age 40  Number of pregnancies: None   Oral Contraceptive Use:  Yes; Years: Oral birth control pills used for approximately eight to nine years.      Menopause Status Postmenopausal  Age of last period: 2 (surgical)  Gynecologic Surgical History:   11/14/20 hysterectomy and bilateral salpingo oophorectomy by Dr. Daved Eriksson on 11/14/20.   Hormone Replacement Therapy Use:  Yes Estrogen pills started in 01/2021 and is currently using.    Years: started 01/2021   Last dose: currently using    Family History of Cancer:   Kristina Velazquez has a personal history of ovarian cancer that was diagnosed in 07/2020.  She had a hysterectomy and bilateral salpingo oophorectomy in 10/2020 by Dr. Daved Eriksson. Kristina Velazquez received six cycles of Carbo/Taxol beginning in 08/2020 and ending in 12/2020. Dr. Daved Eriksson prescribed Olaparib and Haruka started this on 03/03/21.  Mother was diagnosed with unilateral breast cancer at 33 years of age and with ovarian cancer at 33 years of age. She passed away at 74 years of age.   Father was diagnosed with kidney cancer at 33 years of age.   Paternal grandmother was diagnosed with breast cancer at 33 years of age.     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    Mother Kristina Velazquez that her mother's genetic test result also was positive for a BRCA1 genetic mutation. She will bring a copy of her Mother's genetic test result with her to the appointment.    Sister, Kristina Velazquez Velazquez that [name]'s genetic test result was negative.      Breast Health History & Imaging:  Date Test Results   None Kristina Velazquez did not have prior breast imaging or surgeries.       RISK  MODELS   Tyrer Cuzick:    10 Year Risk: 11% 10 Year Population Risk: 0.5%    Lifetime Risk: 58.7% Lifetime Population Risk: 13.3%                *Tyrer-Cuzick Score reflects preliminary calculations. The final Tyrer-Cuzick Score will be recorded by your Provider at the consultation.     GAIL Scale:    Kristina Velazquez has BRCA1 and PMS2 genetic mutations so the [facility] was not completed.        Ovarian cancer (CMS-HCC)   08/08/2020 Imaging    CT scan A/P demonstrated: Ill-defined complex pelvic mass with omental and mesenteric masses with intra-abdominal ascites suggesting a neoplastic process with metastatic disease. No other intraparenchymal metastases    CA125 = 862     08/16/2020 Biopsy    High grade serous carcinoma. ER 85%, PR 10%, positive PAX8, WT-1, p53     09/04/2020 - 01/24/2021 Chemotherapy    Had reaction to both taxol and carbo in C1, plan for inpatient C2  OP GYN CARBOPLATIN (desensitization) + PACLITAXEL (desensitization) (OVARIAN)  Plan Provider: Dorothey Gate, MD  Treatment goal: Curative   Line of treatment: First Line     10/10/2020 Pertinent Labs    Caris:  Genomic LOH high  BRCA negative  MSI stable  CCNE1 not amplified  Her 2 neg    Pt would be candidate for PARP maintenance       10/25/2020 Imaging    CT CAP: no disease in chest. Decrease in size of the bilateral adnexal masses, peritoneal metastases, and malignant ascites suggesting favorable response to therapy by the patient's metastatic ovarian cancer.      10/30/2020 Pertinent Labs    Genetics:  Pathogenic mutation in the BRCA1 and PMS2 gene  Variant of Uncertain Significance in the ATM and MLH1 gene   Update: Kristina Velazquez and ATM variant likely benign       11/14/2020 Surgery    Total abdominal hysterectomy and bilateral salpingo-oophorectomy, omentectomy, radial resection for debulking, rectal disssection with over sewing, hyperthermic intraperitoneal chemotherapy, and appendectomy with Drs Daved Eriksson and Girtha Lama     11/14/2020 Pathology    Stage IIIC High-grade serous carcinoma  ER 85%, PR 10%  MMR proficient     02/08/2021 Imaging    CT  1.  Interval hysterectomy, omentectomy and tumor debulking. Minimal residual areas of peritoneal thickening and nodularity, indeterminate and may be postoperative scarring or from minute residual/recurrent tumor. No   ascites.   2. No new abdominopelvic lymphadenopathy.      02/23/2021 - 03/2023 Chemotherapy    OP GYN OLAPARIB  Plan Provider: Dorothey Gate, MD  Treatment goal: Control  Line of treatment: Maintenance    Held for gastritis intermittently     03/2022 Imaging Received CT CAP report dated 04/16/2022 from [facility]    Impression Velazquez: No CT evidence of thoracic metastasis; stable pulmonary nodule in the right lower lobe measuring 1-2 mm since 04/15/2021; post surgical changes from hysterectomy. No pelvic mass or abdominopelvic lymphadenopathy.       Reproductive History  Menstrual Hx               LMP: 07/22/20               Having Periods:  Yes               Age at first period: 30     Pregnancy Hx  Number of pregnancies: None               Number of live births:                Age of first live birth:                Did you breastfeed: No                            If Yes, how long?                Oral Birth Control:  Yes               Years: 8-9 years               Infertility Medication:  No               Year/Med Name:      Menopausal Hx               Age of last period: N/A               Hormone Replacement Therapy:                 Years:      Health Maintenence  Last Pap: 2021  Abn History of Pap: Denies  Colonoscopy: Denies  Mammogram: Denies  Bone scan: Denies     Advanced Directives   DPOA: No  Living Will: No     Past Medical History:  Past Medical History:    COVID    Genetic testing    Hx antineoplastic chemo    Hx estrogen therapy    Inappropriate sinus tachycardia    Other malignant neoplasm without specification of site    Ovarian cancer (CMS-HCC)     Past Surgical History:  Surgical History:   Procedure Laterality Date    HX SHOULDER SURGERY Right 2008    HX WISDOM TEETH EXTRACTION  2014    placement of port-a-cath with fluoroscopy Left 08/25/2020    Performed by Freund, Gill Lace., MD at [facility] OR    FLUOROSCOPIC GUIDANCE CENTRAL VENOUS ACCESS DEVICE PLACEMENT/ REPLACEMENT/ REMOVAL Left 08/25/2020    Performed by Freund, Gill Lace., MD at [facility] OR    ESOPHAGOGASTRODUODENOSCOPY WITH SPECIMEN COLLECTION BY BRUSHING/ WASHING N/A 11/13/2020    Performed by Caren Channel, MD at [facility] ENDO    COLONOSCOPY DIAGNOSTIC WITH SPECIMEN COLLECTION BY BRUSHING/ WASHING - FLEXIBLE N/A 11/13/2020    Performed by Caren Channel, MD at [facility] ENDO    TOTAL ABDOMINAL HYSTERECTOMY AND BILATERAL SALPINGO-OOPHORECTOMY WITH OMENTECTOMY AND RADICAL RESECTION FOR DEBULKING, APPENDECTOMY, Bilateral 11/14/2020    Performed by Dorothey Gate, MD at [facility] OR    Cytoreductive surgery; largest tumor approx. 6 cm Hyperthermic Intraperitoneal chemotherapy  N/A 11/14/2020    Performed by Al-Kasspooles, Mazin F, MD at [facility] OR    CHEMO CART      HX APPENDECTOMY      HX CHOLECYSTECTOMY      HX HYSTERECTOMY      OVARY SURGERY      TUNNELED VENOUS PORT PLACEMENT       Medications:    Current Outpatient Medications:     amphetamine-dextroamphetamine XR (ADDERALL XR) 30 mg capsule, Take one capsule by mouth twice daily., Disp: , Rfl:     cholestyramine-aspartame (CHOLESTYRAMINE LIGHT) 4 gram powder, Take four  g by mouth twice daily. Mix with at least 4 oz water or non-carbonated liquid; take other medications 1 hour before or 4 hours after cholestyramine powder, Disp: 231 g, Rfl: 3    conjugated estrogens (PREMARIN) 0.625 mg/g vaginal cream, Insert or Apply one-half g to vaginal area daily. Apply to area nightly for the first 2 weeks then switch to every other night., Disp: 30 g, Rfl: 3    diphenhydrAMINE hcl (BENADRYL ALLERGY) 25 mg tablet, Take one tablet to two tablets by mouth at bedtime as needed., Disp: , Rfl:     estradioL (DOTTI) 0.0375 mg/24 hr patch, APPLY ONE PATCH TO TOPICALLY OF SKIN AS DIRECTED TWICE WEEKLY., Disp: 8 patch, Rfl: 3    ivabradine (CORLANOR) 5 mg tablet, Take one tablet by mouth twice daily., Disp: , Rfl:     levothyroxine (SYNTHROID) 25 mcg tablet, Take one tablet by mouth daily 30 minutes before breakfast., Disp: , Rfl:     lidocaine/prilocaine (EMLA) 2.5/2.5 % topical cream, Apply  topically to affected area as Needed. Apply 30-60 minutes prior to port access., Disp: 30 g, Rfl: 0    olaparib (LYNPARZA) 150 mg tablet, Take two tablets by mouth twice daily., Disp: 120 tablet, Rfl: 3    ondansetron HCL (ZOFRAN) 8 mg tablet, Take 1 tablet by mouth twice a day 30 minutes prior to each Olaparib dose.  Indications: prevent nausea and vomiting from cancer chemotherapy, Disp: 60 tablet, Rfl: 3    pantoprazole DR (PROTONIX) 40 mg tablet, Take one tablet by mouth twice daily., Disp: , Rfl:     SEMAGLUTIDE SC, Inject  under the skin., Disp: , Rfl:     Allergies:  Allergies   Allergen Reactions    Carboplatin CHEST TIGHTNESS, FLUSHING (SKIN), HEADACHE, RASH, SHORTNESS OF BREATH, SEE COMMENTS and REDNESS     Did not complete the remainder of the infusion.  Additional reaction 01/24/21 during bag A, rash located neck, chest and back of ears, dyspnea, hypertension with BP 140/94, tachycardia with HR 138 and chest tightness.  Completed the remainder of the infusion.    Hydrocodone STOMACH UPSET and VOMITING    Paclitaxel CHEST TIGHTNESS, VISION CHANGES and REDNESS    Peanut RASH     Social History:  Social History     Socioeconomic History    Marital status: Married   Tobacco Use    Smoking status: Never    Smokeless tobacco: Never   Vaping Use    Vaping status: Never Used   Substance and Sexual Activity    Alcohol use: Yes     Alcohol/week: 1.0 standard drink of alcohol     Types: 1 Glasses of wine per week     Comment: OCASSIONALLY     Drug use: Never    Sexual activity: Yes     Partners: Male     Birth control/protection: Post-menopausal     Family History:  Family History   Problem Relation Name Age of Onset    Genetic Disorder Mother Gaylord Kearns Mother Adell Hones 59    Cancer-Ovarian Mother Adell Hones 41    Heart Disease Mother Adell Hones     Depression Mother Adell Hones     Kidney Cancer Father John 35    Hypertension Father John     None Reported Sister      None Reported Brother      Unknown to Patient Paternal Uncle      Hypertension  Maternal Grandmother Merlyn Starring     Arthritis-osteo Maternal Grandmother Merlyn Starring     Migraines Maternal Grandmother Merlyn Starring Depression Maternal Grandmother Lee     Heart Disease Maternal Grandfather Adaline Holly     Hypertension Maternal Grandfather Huel [place] Paternal Ward Guy 79    Hypertension Paternal Peggye Bowers     Stroke Paternal Grandfather Johnny      REVIEW OF SYSTEMS:        CONSTITUTIONAL: Negative unless stated in HPI  EYES: Negative unless stated in HPI  ENT: Negative unless stated in HPI  RESPIRATORY: Negative unless stated in HPI  CARDIOVASCULAR: Negative unless stated in HPI  GI: Negative unless stated in HPI  GU: Negative unless stated in HPI  MUSCULO-SKELETAL: Negative unless stated in HPI  SKIN: Negative unless stated in HPI  ENDOCRINE: Negative unless stated in HPI  HEMATOLOGIC: Negative unless stated in HPI    ECOG 1    Physical Exam:    BP 118/80 (BP Source: Arm, Left Upper, Patient Position: Sitting)  - Pulse 76  - Temp 36.6 ?C (97.8 ?F) (Temporal)  - Resp 16  - Ht 172.1 cm (5' 7.75)  - Wt 104.2 kg (229 lb 12.8 oz)  - LMP 08/17/2020  - SpO2 100%  - BMI 35.20 kg/m?   GENERAL APPEARANCE: Appears healthy.  Alert; in no acute distress.  Pleasant.  HEENT: Unremarkable. No tenderness or masses noted.  LUNGS: Chest symmetrical. Good diaphragmatic excursion.   ABDOMEN:  Abdomen soft, non-tender.  No masses, organomegaly, or hernia. No clinical evidence of ascites.  Well healed vertical incision   PELVIC: External genitalia, urethral meatus, urethra, bladder and vagina normal.  Cervix, uterus, adnexae surgically absent.  Anus and perineum normal. Bimanual and rectovaginal exam without masses or nodularity on the pelvic floor. Exam chaperoned by nurse  EXTREMITIES: Extremities normal. No joint deformities, edema, or skin discoloration. Station and gait normal.  SKIN: Skin color, texture, turgor normal. No rashes or lesions.  LYMPH NODES: No palpable supraclavicular, cervical or inguinal lymph nodes    CBC w/Diff    Lab Results   Component Value Date/Time    WBC 8.50 05/30/2023 09:12 AM    RBC 4.87 05/30/2023 09:12 AM    HGB 15.0 05/30/2023 09:12 AM    HCT 43.5 05/30/2023 09:12 AM    MCV 89.3 05/30/2023 09:12 AM    MCH 30.8 05/30/2023 09:12 AM    MCHC 34.4 05/30/2023 09:12 AM    RDW 13.6 05/30/2023 09:12 AM    PLTCT 290 05/30/2023 09:12 AM    MPV 8.1 05/30/2023 09:12 AM    Lab Results   Component Value Date/Time    NEUT 66.1 05/30/2023 09:12 AM    ANC 5.60 05/30/2023 09:12 AM    LYMA 23.6 (L) 05/30/2023 09:12 AM    ALC 2.00 05/30/2023 09:12 AM    MONA 8.3 05/30/2023 09:12 AM    AMC 0.70 05/30/2023 09:12 AM    EOSA 1.4 05/30/2023 09:12 AM    AEC 0.10 05/30/2023 09:12 AM    BASA 0.6 05/30/2023 09:12 AM    ABC 0.10 05/30/2023 09:12 AM          Comprehensive Metabolic Profile    Lab Results   Component Value Date/Time    NA 139 05/30/2023 09:12 AM    K 4.1 05/30/2023 09:12 AM    CL 105 05/30/2023 09:12 AM    CO2 23 05/30/2023 09:12 AM    GAP 11 05/30/2023 09:12 AM  BUN 12 05/30/2023 09:12 AM    CR 0.89 05/30/2023 09:12 AM    GLU 96 05/30/2023 09:12 AM    Lab Results   Component Value Date/Time    CA 9.3 05/30/2023 09:12 AM    PO4 3.7 11/17/2020 04:09 AM    ALBUMIN 4.6 05/30/2023 09:12 AM    TOTPROT 7.5 05/30/2023 09:12 AM    ALKPHOS 61 05/30/2023 09:12 AM    AST 24 05/30/2023 09:12 AM    ALT 44 05/30/2023 09:12 AM    TOTBILI 0.6 05/30/2023 09:12 AM    GFR >60 05/30/2023 09:12 AM          Other labs    No results found for: ESR No results found for: LDH     CA-125   Date Value Ref Range Status   05/30/2023 11 <35 U/mL Final     Comment:     The method used to detect CA-125 level is an immunoassay manufactured by [employer]. Patient results determined using different manufacturers or methods may not be comparable.   02/21/2023 9 <35 U/mL Final     Comment:     The method used to detect CA-125 level is an immunoassay manufactured by [employer]. Patient results determined using different manufacturers or methods may not be comparable.   11/15/2022 9 <35 U/ml Final   08/09/2022 8 <35 U/ml Final 04/25/2022 8 <35 U/ml Final         ASSESSMENT/PLAN:  Mileydi Deger is a 33 y.o. 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 She started Olaparib 300mg  BID on 03/03/21, completed 03/2023. Patient presents to clinic today for her 3 month follow up visit.     No evidence of disease on exam today.     CA125 is 11    Completed olaparib        Inappropriate sinus tachycardia  Persistent tachycardia and fatigue managed with Corlanor. Suspected sinus node dysfunction. Ablation considered if medication fails. No identified cause for dysfunction. Evaluated with heart monitor.  - Continue Corlanor for heart rate control.  - Monitor symptoms, consider ablation if Corlanor ineffective.     Discussed possible symptoms of GYN cancers or cancer recurrence such as cough, chest pain, early satiety, abdominal pain/bloating, N/V, vaginal bleeding/discharge, new vulvar lesions/itching/irritation, change in bowel/bladder habits, dizziness, and headache.      Lab, RV in 3 months. Will get baseline imaging now that we have completed parp maintenance      Dorothey Gate, MD            Total Time Today was 30 minutes in the following activities: Preparing to see the patient, Obtaining and/or reviewing separately obtained history, Performing a medically appropriate examination and/or evaluation, Counseling and educating the patient/family/caregiver, Ordering medications, tests, or procedures, Documenting clinical information in the electronic or other health record, Independently interpreting results (not separately reported) and communicating results to the patient/family/caregiver, and Care coordination (not separately reported)

## 2023-05-30 ENCOUNTER — Encounter: Admit: 2023-05-30 | Discharge: 2023-05-30 | Payer: BLUE CROSS/BLUE SHIELD

## 2023-05-30 DIAGNOSIS — C569 Malignant neoplasm of unspecified ovary: Secondary | ICD-10-CM

## 2023-05-30 LAB — CBC AND DIFF
~~LOC~~ BKR ABSOLUTE BASO COUNT: 0.1 10*3/uL (ref 0.00–0.20)
~~LOC~~ BKR ABSOLUTE EOS COUNT: 0.1 10*3/uL (ref 0.00–0.45)
~~LOC~~ BKR ABSOLUTE LYMPH COUNT: 2 10*3/uL (ref 1.00–4.80)
~~LOC~~ BKR ABSOLUTE MONO COUNT: 0.7 10*3/uL (ref 0.00–0.80)
~~LOC~~ BKR ABSOLUTE NEUTROPHIL: 5.6 10*3/uL (ref 1.80–7.00)
~~LOC~~ BKR BASOPHILS %: 0.6 % (ref 0.0–2.0)
~~LOC~~ BKR EOSINOPHILS %: 1.4 % (ref 0.0–5.0)
~~LOC~~ BKR HEMATOCRIT: 43 % (ref 36.0–45.0)
~~LOC~~ BKR LYMPHOCYTES %: 23 % — ABNORMAL LOW (ref 24.0–44.0)
~~LOC~~ BKR MCH: 30 pg (ref 26.0–34.0)
~~LOC~~ BKR MCHC: 34 g/dL (ref 32.0–36.0)
~~LOC~~ BKR MCV: 89 fL (ref 80.0–100.0)
~~LOC~~ BKR MONOCYTES %: 8.3 % (ref 4.0–12.0)
~~LOC~~ BKR PLATELET COUNT: 290 10*3/uL (ref 150–400)
~~LOC~~ BKR RDW: 13 % (ref 11.0–15.0)

## 2023-05-30 LAB — COMPREHENSIVE METABOLIC PANEL
~~LOC~~ BKR ALK PHOSPHATASE: 61 U/L (ref 25–110)
~~LOC~~ BKR AST: 24 U/L (ref 7–40)
~~LOC~~ BKR CHLORIDE: 105 mmol/L (ref 98–110)
~~LOC~~ BKR GLUCOSE, RANDOM: 96 mg/dL (ref 70–100)
~~LOC~~ BKR POTASSIUM: 4.1 mmol/L (ref 3.5–5.1)
~~LOC~~ BKR SODIUM, SERUM: 139 mmol/L (ref 137–147)

## 2023-05-30 LAB — CA125: ~~LOC~~ BKR CA-125: 11 U/mL (ref <35)

## 2023-06-30 ENCOUNTER — Encounter: Admit: 2023-06-30 | Discharge: 2023-06-30 | Payer: BLUE CROSS/BLUE SHIELD

## 2023-06-30 NOTE — Telephone Encounter
 Received incoming fax from [facility] of patient's CT report from CT scan done on 06/26/23.     Impression:   Stable CT scan examination of chest abdomen and pelvis without evidence for disease progression or recurrence. No metastatic disease is identified.   No acute abnormalities    Will have Dr. Daved Eriksson review report. Copy of report sent to medical records to be scanned to patient's chart.

## 2023-07-31 ENCOUNTER — Encounter: Admit: 2023-07-31 | Discharge: 2023-07-31 | Payer: BLUE CROSS/BLUE SHIELD

## 2023-08-28 ENCOUNTER — Encounter: Admit: 2023-08-28 | Discharge: 2023-08-28 | Payer: BLUE CROSS/BLUE SHIELD

## 2023-09-05 ENCOUNTER — Encounter: Admit: 2023-09-05 | Discharge: 2023-09-05 | Payer: BLUE CROSS/BLUE SHIELD

## 2023-09-05 DIAGNOSIS — C569 Malignant neoplasm of unspecified ovary: Principal | ICD-10-CM

## 2023-09-05 LAB — CA125: ~~LOC~~ BKR CA-125: 10 U/mL (ref <35)

## 2023-09-05 NOTE — Progress Notes
 Subjective     GYNECOLOGIC ONCOLOGY EVALUATION    Name:Kristina Velazquez    Date: 09/05/2023    Referring & Primary Care Physician: Irish Simmonds    Chief Complaint:   Chief Complaint   Patient presents with    Heme/Onc Care     History of Present Illness:    Kristina Velazquez is a 33 y.o. 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 She started on Olaparib  300mg  BID on 03/03/21 and completed 03/2023. Patient presents to clinic today for her 6 month follow up visit.        She experiences sinus tachycardia, characterized by a persistently elevated heart rate over 120 beats per minute without physical activity, leading to exhaustion by the end of the day. She is currently on Corlanor, which has been increased to 7.5 mg, resulting in her heart rate stabilizing to the 70s and 80s, and she feels 'great'.    Her GERD symptoms, including acid reflux and abdominal pain, are generally resolved but can vary depending on her diet. She is making efforts to eat healthier to manage these symptoms.    No itchiness or abnormal discharge, although she notes some white discharge, which she considers normal.    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 She recently visited [place] of the [place] with family and discussed the possibility of hosting a foreign Therapist, sports, but wants to see how coaching goes first before making a decision.            Onc Timeline Overview Note   Reproductive History  Menarche: Age 47  Number of pregnancies: None   Oral Contraceptive Use:  Yes; Years: Oral birth control pills used for approximately eight to nine years.      Menopause Status Postmenopausal  Age of last period: 86 (surgical)  Gynecologic Surgical History:   11/14/20 hysterectomy and bilateral salpingo oophorectomy by Dr. Bonney on 11/14/20.   Hormone Replacement Therapy Use:  Yes Estrogen pills started in 01/2021 and is currently using.    Years: started 01/2021   Last dose: currently using    Family History of Cancer:   Kristina Velazquez has a personal history of ovarian cancer that was diagnosed in 07/2020.  She had a hysterectomy and bilateral salpingo oophorectomy in 10/2020 by Dr. Bonney. Kenslei received six cycles of Carbo/Taxol  beginning in 08/2020 and ending in 12/2020. Dr. Bonney prescribed Olaparib  and Aimy started this on 03/03/21.  Mother was diagnosed with unilateral breast cancer at 33 years of age and with ovarian cancer at 33 years of age. She passed away at 50 years of age.   Father was diagnosed with kidney cancer at 33 years of age.   Paternal grandmother was diagnosed with breast cancer at 33 years of age.     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    Mother Kristina Velazquez states that her mother's genetic test result also was positive for a BRCA1 genetic mutation. She will bring a copy of her Mother's genetic test result with her to the appointment.  Sister, Kristina Velazquez states that Kristina Velazquez's genetic test result was negative.      Breast Health History & Imaging:  Date Test Results   None Derinda did not have prior breast imaging or surgeries.       RISK MODELS   Tyrer Cuzick:    10 Year Risk: 11% 10 Year Population Risk: 0.5%    Lifetime Risk: 58.7% Lifetime Population Risk: 13.3%                *Tyrer-Cuzick Score reflects preliminary calculations. The final Tyrer-Cuzick Score will be recorded by your Provider at the consultation.     GAIL Scale:    Azuree has BRCA1 and PMS2 genetic mutations so the [facility] was not completed.        Ovarian cancer (CMS-HCC)   08/08/2020 Imaging    CT scan A/P demonstrated: Ill-defined complex pelvic mass with omental and mesenteric masses with intra-abdominal ascites suggesting a neoplastic process with metastatic disease. No other intraparenchymal metastases    CA125 = 862     08/16/2020 Biopsy    High grade serous carcinoma. ER 85%, PR 10%, positive PAX8, WT-1, p53     09/04/2020 - 01/24/2021 Chemotherapy    Had reaction to both taxol  and carbo in C1, plan for inpatient C2  OP GYN CARBOPLATIN  (desensitization) + PACLITAXEL  (desensitization) (OVARIAN)  Plan Provider: Alfonso JONETTA Feeling, MD  Treatment goal: Curative   Line of treatment: First Line     10/10/2020 Pertinent Labs    Caris:  Genomic LOH high  BRCA negative  MSI stable  CCNE1 not amplified  Her 2 neg    Pt would be candidate for PARP maintenance       10/25/2020 Imaging    CT CAP: no disease in chest. Decrease in size of the bilateral adnexal masses, peritoneal metastases, and malignant ascites suggesting favorable response to therapy by the patient's metastatic ovarian cancer.      10/30/2020 Pertinent Labs    Genetics:  Pathogenic mutation in the BRCA1 and PMS2 gene  Variant of Uncertain Significance in the ATM and MLH1 gene   Update: MLH and ATM variant likely benign       11/14/2020 Surgery    Total abdominal hysterectomy and bilateral salpingo-oophorectomy, omentectomy, radial resection for debulking, rectal disssection with over sewing, hyperthermic intraperitoneal chemotherapy, and appendectomy with Drs Feeling and Rexene     11/14/2020 Pathology    Stage IIIC High-grade serous carcinoma  ER 85%, PR 10%  MMR proficient     02/08/2021 Imaging    CT  1.  Interval hysterectomy, omentectomy and tumor debulking. Minimal residual areas of peritoneal thickening and nodularity, indeterminate and may be postoperative scarring or from minute residual/recurrent tumor. No   ascites.   2. No new abdominopelvic lymphadenopathy.      02/23/2021 - 03/2023 Chemotherapy    OP GYN OLAPARIB   Plan Provider: Alfonso JONETTA Feeling, MD  Treatment goal: Control  Line of treatment: Maintenance    Held for gastritis intermittently     03/2022 Imaging      Received CT CAP report dated 04/16/2022 [facility].    Impression states: No CT evidence of thoracic metastasis; stable pulmonary nodule in the right lower lobe measuring 1-2 mm since 04/15/2021; post surgical changes from hysterectomy. No pelvic mass or abdominopelvic lymphadenopathy.       Reproductive History  Menstrual Hx               LMP: 07/22/20  Having Periods:  Yes               Age at first period: 24     Pregnancy Hx               Number of pregnancies: None               Number of live births:                Age of first live birth:                Did you breastfeed: No                            If Yes, how long?                Oral Birth Control:  Yes               Years: 8-9 years               Infertility Medication:  No               Year/Med Name:      Menopausal Hx               Age of last period: N/A               Hormone Replacement Therapy:                 Years:      Health Maintenence  Last Pap: 2021  Abn History of Pap: Denies  Colonoscopy: Denies  Mammogram: Denies  Bone scan: Denies     Advanced Directives   DPOA: No  Living Will: No     Past Medical History:  Past Medical History:    COVID    Genetic testing    Hx antineoplastic chemo    Hx estrogen therapy    Inappropriate sinus tachycardia    Other malignant neoplasm without specification of site    Ovarian cancer (CMS-HCC)     Past Surgical History:  Surgical History:   Procedure Laterality Date    HX SHOULDER SURGERY Right 2008    HX WISDOM TEETH EXTRACTION  2014    placement of port-a-cath with fluoroscopy Left 08/25/2020    Performed by Freund, Elsie LITTIE Raddle., MD at [facility] OR    FLUOROSCOPIC GUIDANCE CENTRAL VENOUS ACCESS DEVICE PLACEMENT/ REPLACEMENT/ REMOVAL Left 08/25/2020 Performed by Freund, Elsie LITTIE Raddle., MD at [facility] OR    ESOPHAGOGASTRODUODENOSCOPY WITH SPECIMEN COLLECTION BY BRUSHING/ WASHING N/A 11/13/2020    Performed by Verdis Anabel RAMAN, MD at [facility] ENDO    COLONOSCOPY DIAGNOSTIC WITH SPECIMEN COLLECTION BY BRUSHING/ WASHING - FLEXIBLE N/A 11/13/2020    Performed by Verdis Anabel RAMAN, MD at [facility] ENDO    TOTAL ABDOMINAL HYSTERECTOMY AND BILATERAL SALPINGO-OOPHORECTOMY WITH OMENTECTOMY AND RADICAL RESECTION FOR DEBULKING, APPENDECTOMY, Bilateral 11/14/2020    Performed by Bonney Alfonso BIRCH, MD at [facility] OR    Cytoreductive surgery; largest tumor approx. 6 cm Hyperthermic Intraperitoneal chemotherapy  N/A 11/14/2020    Performed by Al-Kasspooles, Mazin F, MD at [facility] OR    CHEMO CART      HX APPENDECTOMY      HX CHOLECYSTECTOMY      HX HYSTERECTOMY      OVARY SURGERY      TUNNELED VENOUS PORT PLACEMENT  Medications:    Current Outpatient Medications:     cholestyramine -aspartame (CHOLESTYRAMINE  LIGHT) 4 gram powder, Take four g by mouth twice daily. Mix with at least 4 oz water  or non-carbonated liquid; take other medications 1 hour before or 4 hours after cholestyramine  powder, Disp: 231 g, Rfl: 3    conjugated estrogens  (PREMARIN ) 0.625 mg/g vaginal cream, Insert or Apply one-half g to vaginal area daily. Apply to area nightly for the first 2 weeks then switch to every other night., Disp: 30 g, Rfl: 3    diphenhydrAMINE  hcl (BENADRYL  ALLERGY) 25 mg tablet, Take one tablet to two tablets by mouth at bedtime as needed., Disp: , Rfl:     estradioL  (DOTTI ) 0.0375 mg/24 hr patch, APPLY ONE PATCH TOPICALLY TO SKIN AS DIRECTED TWICE WEEKLY., Disp: 8 patch, Rfl: 3    ivabradine (CORLANOR) 5 mg tablet, Take one tablet by mouth twice daily. (Patient taking differently: Take 1.5 tablets by mouth twice daily.), Disp: , Rfl:     levothyroxine (SYNTHROID) 25 mcg tablet, Take one tablet by mouth daily 30 minutes before breakfast., Disp: , Rfl:     lidocaine /prilocaine  (EMLA ) 2.5/2.5 % topical cream, Apply  topically to affected area as Needed. Apply 30-60 minutes prior to port access., Disp: 30 g, Rfl: 0    ondansetron  HCL (ZOFRAN ) 8 mg tablet, Take 1 tablet by mouth twice a day 30 minutes prior to each Olaparib  dose.  Indications: prevent nausea and vomiting from cancer chemotherapy (Patient not taking: Reported on 09/05/2023), Disp: 60 tablet, Rfl: 3    Allergies:  Allergies   Allergen Reactions    Carboplatin  CHEST TIGHTNESS, FLUSHING (SKIN), HEADACHE, RASH, SHORTNESS OF BREATH, SEE COMMENTS and REDNESS     Did not complete the remainder of the infusion.  Additional reaction 01/24/21 during bag A, rash located neck, chest and back of ears, dyspnea, hypertension with BP 140/94, tachycardia with HR 138 and chest tightness.  Completed the remainder of the infusion.    Hydrocodone STOMACH UPSET and VOMITING    Paclitaxel  CHEST TIGHTNESS, VISION CHANGES and REDNESS    Peanut RASH     Social History:  Social History     Socioeconomic History    Marital status: Married   Tobacco Use    Smoking status: Never    Smokeless tobacco: Never   Vaping Use    Vaping status: Never Used   Substance and Sexual Activity    Alcohol use: Yes     Alcohol/week: 1.0 standard drink of alcohol     Types: 1 Glasses of wine per week     Comment: OCASSIONALLY     Drug use: Never    Sexual activity: Yes     Partners: Male     Birth control/protection: Post-menopausal     Family History:  Family History   Problem Relation Name Age of Onset    Genetic Disorder Mother Nena AMADO Ferries Mother Nena 61    Cancer-Ovarian Mother Nena 55    Heart Disease Mother Nena     Depression Mother Nena     Kidney Cancer Father John 31    Hypertension Father John     None Reported Sister      None Reported Brother      Unknown to Patient Paternal Uncle      Hypertension Maternal Grandmother Jama     Arthritis-osteo Maternal Grandmother Velazquez     Migraines Maternal Grandmother Jama  Depression Maternal Grandmother Velazquez Heart Disease Maternal Grandfather Helayne     Hypertension Maternal Grandfather Helayne Ferries Paternal Grandmother Carole 79    Hypertension Paternal Apolinar Pagoda     Stroke Paternal Grandfather Johnny      REVIEW OF SYSTEMS:        CONSTITUTIONAL: Negative unless stated in HPI  EYES: Negative unless stated in HPI  ENT: Negative unless stated in HPI  RESPIRATORY: Negative unless stated in HPI  CARDIOVASCULAR: Negative unless stated in HPI  GI: Negative unless stated in HPI  GU: Negative unless stated in HPI  MUSCULO-SKELETAL: Negative unless stated in HPI  SKIN: Negative unless stated in HPI  ENDOCRINE: Negative unless stated in HPI  HEMATOLOGIC: Negative unless stated in HPI    ECOG 1    Physical Exam:    BP 119/84 (BP Source: Arm, Left Upper, Patient Position: Sitting)  - Pulse 78  - Temp 36.3 ?C (97.3 ?F) (Temporal)  - Resp 16  - Ht 174 cm (5' 8.5) Comment: 09/05/2023 WITH SHOES - Wt 103 kg (227 lb)  - LMP 08/17/2020  - SpO2 100%  - BMI 34.01 kg/m?   GENERAL APPEARANCE: Appears healthy.  Alert; in no acute distress.  Pleasant.  HEENT: Unremarkable. No tenderness or masses noted.  LUNGS: Chest symmetrical. Good diaphragmatic excursion.   ABDOMEN:  Abdomen soft, non-tender.  No masses, organomegaly, or hernia. No clinical evidence of ascites.  Well healed vertical incision   PELVIC: External genitalia, urethral meatus, urethra, bladder and vagina normal.  Cervix, uterus, adnexae surgically absent.  Anus and perineum normal. Bimanual and rectovaginal exam without masses or nodularity on the pelvic floor. Exam chaperoned by nurse  EXTREMITIES: Extremities normal. No joint deformities, edema, or skin discoloration. Station and gait normal.  SKIN: Skin color, texture, turgor normal. No rashes or lesions.  LYMPH NODES: No palpable supraclavicular, cervical or inguinal lymph nodes    CBC w/Diff    Lab Results   Component Value Date/Time    WBC 8.50 05/30/2023 09:12 AM    RBC 4.87 05/30/2023 09:12 AM HGB 15.0 05/30/2023 09:12 AM    HCT 43.5 05/30/2023 09:12 AM    MCV 89.3 05/30/2023 09:12 AM    MCH 30.8 05/30/2023 09:12 AM    MCHC 34.4 05/30/2023 09:12 AM    RDW 13.6 05/30/2023 09:12 AM    PLTCT 290 05/30/2023 09:12 AM    MPV 8.1 05/30/2023 09:12 AM    Lab Results   Component Value Date/Time    NEUT 66.1 05/30/2023 09:12 AM    ANC 5.60 05/30/2023 09:12 AM    LYMA 23.6 (L) 05/30/2023 09:12 AM    ALC 2.00 05/30/2023 09:12 AM    MONA 8.3 05/30/2023 09:12 AM    AMC 0.70 05/30/2023 09:12 AM    EOSA 1.4 05/30/2023 09:12 AM    AEC 0.10 05/30/2023 09:12 AM    BASA 0.6 05/30/2023 09:12 AM    ABC 0.10 05/30/2023 09:12 AM          Comprehensive Metabolic Profile    Lab Results   Component Value Date/Time    NA 139 05/30/2023 09:12 AM    K 4.1 05/30/2023 09:12 AM    CL 105 05/30/2023 09:12 AM    CO2 23 05/30/2023 09:12 AM    GAP 11 05/30/2023 09:12 AM    BUN 12 05/30/2023 09:12 AM    CR 0.89 05/30/2023 09:12 AM    GLU 96 05/30/2023 09:12 AM  Lab Results   Component Value Date/Time    CA 9.3 05/30/2023 09:12 AM    PO4 3.7 11/17/2020 04:09 AM    ALBUMIN  4.6 05/30/2023 09:12 AM    TOTPROT 7.5 05/30/2023 09:12 AM    ALKPHOS 61 05/30/2023 09:12 AM    AST 24 05/30/2023 09:12 AM    ALT 44 05/30/2023 09:12 AM    TOTBILI 0.6 05/30/2023 09:12 AM    GFR >60 05/30/2023 09:12 AM          Other labs    No results found for: ESR No results found for: LDH     CA-125   Date Value Ref Range Status   09/05/2023 10 <35 U/mL Final     Comment:     The method used to detect CA-125 level is an immunoassay manufactured by [employer]. Patient results determined using different manufacturers or methods may not be comparable.   05/30/2023 11 <35 U/mL Final     Comment:     The method used to detect CA-125 level is an immunoassay manufactured by [employer]. Patient results determined using different manufacturers or methods may not be comparable.   02/21/2023 9 <35 U/mL Final     Comment:     The method used to detect CA-125 level is an immunoassay manufactured by [employer]. Patient results determined using different manufacturers or methods may not be comparable.   11/15/2022 9 <35 U/ml Final   08/09/2022 8 <35 U/ml Final         ASSESSMENT/PLAN:  Kristina Velazquez is a 33 y.o. 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 She started Olaparib  300mg  BID on 03/03/21, completed 03/2023. Patient presents to clinic today for her 6 month follow up visit.     No evidence of disease on exam today.       Ovarian cancer surveillance  CT scan showed no changes and CA-125 levels remain stable.  - Repeat CT scan in one year unless symptoms or CA-125 levels change.  - Maintain low threshold for ordering CT scan if symptoms change.    Sinus tachycardia  Corlanor stabilized heart rates in the 70s and 80s. Recent dose adjustment to 7.5 mg improved symptoms.  - Continue Corlanor at 7.5 mg.          RV 3 months with CA125    Alfonso JONETTA Feeling, MD            Total Time Today was 30 minutes in the following activities: Preparing to see the patient, Obtaining and/or reviewing separately obtained history, Performing a medically appropriate examination and/or evaluation, Counseling and educating the patient/family/caregiver, Ordering medications, tests, or procedures, Documenting clinical information in the electronic or other health record, Independently interpreting results (not separately reported) and communicating results to the patient/family/caregiver, and Care coordination (not separately reported)

## 2023-11-12 ENCOUNTER — Encounter: Admit: 2023-11-12 | Discharge: 2023-11-12 | Payer: BLUE CROSS/BLUE SHIELD

## 2023-11-14 ENCOUNTER — Encounter: Admit: 2023-11-14 | Discharge: 2023-11-14 | Payer: BLUE CROSS/BLUE SHIELD

## 2023-11-19 ENCOUNTER — Encounter: Admit: 2023-11-19 | Discharge: 2023-11-19 | Payer: BLUE CROSS/BLUE SHIELD

## 2023-11-19 DIAGNOSIS — C569 Malignant neoplasm of unspecified ovary: Principal | ICD-10-CM

## 2023-11-20 ENCOUNTER — Encounter: Admit: 2023-11-20 | Discharge: 2023-11-20 | Payer: BLUE CROSS/BLUE SHIELD

## 2023-11-20 LAB — CA125: ~~LOC~~ BKR CA-125: 12 U/mL (ref <35)

## 2023-12-13 ENCOUNTER — Encounter: Admit: 2023-12-13 | Discharge: 2023-12-13 | Payer: BLUE CROSS/BLUE SHIELD

## 2023-12-30 ENCOUNTER — Encounter: Admit: 2023-12-30 | Discharge: 2023-12-30 | Payer: BLUE CROSS/BLUE SHIELD

## 2023-12-30 DIAGNOSIS — Z6834 Body mass index (BMI) 34.0-34.9, adult: Principal | ICD-10-CM

## 2024-02-03 ENCOUNTER — Encounter: Admit: 2024-02-03 | Discharge: 2024-02-03 | Payer: BLUE CROSS/BLUE SHIELD

## 2024-02-09 ENCOUNTER — Encounter: Admit: 2024-02-09 | Discharge: 2024-02-09 | Payer: BLUE CROSS/BLUE SHIELD

## 2024-02-10 ENCOUNTER — Encounter: Admit: 2024-02-10 | Discharge: 2024-02-10 | Payer: BLUE CROSS/BLUE SHIELD

## 2024-02-20 ENCOUNTER — Encounter: Admit: 2024-02-20 | Discharge: 2024-02-20 | Payer: BLUE CROSS/BLUE SHIELD

## 2024-02-27 ENCOUNTER — Encounter: Admit: 2024-02-27 | Discharge: 2024-02-27 | Payer: BLUE CROSS/BLUE SHIELD

## 2024-02-27 DIAGNOSIS — C569 Malignant neoplasm of unspecified ovary: Principal | ICD-10-CM

## 2024-02-27 LAB — CA125: ~~LOC~~ BKR CA-125: 49 U/mL — ABNORMAL HIGH (ref <35)

## 2024-03-01 ENCOUNTER — Encounter: Admit: 2024-03-01 | Discharge: 2024-03-01 | Payer: BLUE CROSS/BLUE SHIELD

## 2024-03-01 DIAGNOSIS — C569 Malignant neoplasm of unspecified ovary: Principal | ICD-10-CM
